# Patient Record
Sex: Female | Born: 1995 | Race: Black or African American | Hispanic: No | Marital: Married | Smoking: Never smoker
Health system: Southern US, Community
[De-identification: ages and names within clinical notes are randomized; demographics above are authoritative.]

## PROBLEM LIST (undated history)

## (undated) ENCOUNTER — Inpatient Hospital Stay (HOSPITAL_COMMUNITY): Payer: Self-pay

## (undated) DIAGNOSIS — Z98891 History of uterine scar from previous surgery: Secondary | ICD-10-CM

---

## 2014-10-13 HISTORY — PX: WISDOM TOOTH EXTRACTION: SHX21

## 2015-05-30 ENCOUNTER — Encounter (HOSPITAL_COMMUNITY): Payer: Self-pay | Admitting: Emergency Medicine

## 2015-05-30 ENCOUNTER — Emergency Department (INDEPENDENT_AMBULATORY_CARE_PROVIDER_SITE_OTHER)
Admission: EM | Admit: 2015-05-30 | Discharge: 2015-05-30 | Disposition: A | Discharge status: Home / Self Care | Payer: Medicaid Other | Source: Home / Self Care | Attending: Emergency Medicine | Admitting: Emergency Medicine

## 2015-05-30 DIAGNOSIS — N390 Urinary tract infection, site not specified: Secondary | ICD-10-CM

## 2015-05-30 DIAGNOSIS — Z309 Encounter for contraceptive management, unspecified: Secondary | ICD-10-CM

## 2015-05-30 DIAGNOSIS — Z30011 Encounter for initial prescription of contraceptive pills: Secondary | ICD-10-CM

## 2015-05-30 LAB — POCT URINALYSIS DIP (DEVICE)
BILIRUBIN URINE: NEGATIVE
GLUCOSE, UA: NEGATIVE mg/dL
Ketones, ur: NEGATIVE mg/dL
NITRITE: NEGATIVE
Protein, ur: NEGATIVE mg/dL
Specific Gravity, Urine: 1.025 (ref 1.005–1.030)
UROBILINOGEN UA: 0.2 mg/dL (ref 0.0–1.0)
pH: 6 (ref 5.0–8.0)

## 2015-05-30 LAB — POCT PREGNANCY, URINE: Preg Test, Ur: NEGATIVE

## 2015-05-30 MED ORDER — CEPHALEXIN 500 MG PO CAPS: 500.0000 mg | ORAL_CAPSULE | Freq: Four times a day (QID) | ORAL | Status: DC

## 2015-05-30 MED ORDER — NORGESTIMATE-ETH ESTRADIOL 0.25-35 MG-MCG PO TABS: 1.0000 | ORAL_TABLET | Freq: Every day | ORAL | Status: DC

## 2015-05-30 NOTE — ED Provider Notes (Signed)
CSN: [id number]     Arrival date & time 05/30/15  1302 History   First MD Initiated Contact with Patient 05/30/15 1310     Chief Complaint  Patient presents with  . Urinary Tract Infection   (Consider location/radiation/quality/duration/timing/severity/associated sxs/prior Treatment) HPI  She is a 19 year old woman here with her husband for evaluation of dysuria. She speaks Swahili. Her husband acts as interpreter at her request.  She reports a four-day history of urinary frequency, urinary hesitation, and dysuria. She also reports some lower abdominal pain. She reports a little bit of nausea, but this has resolved. No fevers, chills, vomiting, flank pain. She has not tried anything.  They would also like something to prevent pregnancy. She is currently breast-feeding an 74-month-old daughter. She has not resumed her menstrual cycle. No history of smoking.  History reviewed. No pertinent past medical history. No past surgical history on file. History reviewed. No pertinent family history. Social History  Substance Use Topics  . Smoking status: None  . Smokeless tobacco: None  . Alcohol Use: None   OB History    No data available     Review of Systems As in history of present illness Allergies  Review of patient's allergies indicates no known allergies.  Home Medications   Prior to Admission medications   Medication Sig Start Date End Date Taking? Authorizing Provider  cephALEXin (KEFLEX) 500 MG capsule Take 1 capsule (500 mg total) by mouth 4 (four) times daily. 05/30/15   Charm Rings, MD  norgestimate-ethinyl estradiol (ORTHO-CYCLEN,SPRINTEC,PREVIFEM) 0.25-35 MG-MCG tablet Take 1 tablet by mouth daily. Start on Sunday 05/30/15   Charm Rings, MD   BP 142/89 mmHg  Pulse 55  Temp(Src) 98 F (36.7 C) (Oral)  Resp 16  SpO2 99%  LMP 05/08/2015 Physical Exam  Constitutional: She appears well-developed and well-nourished. No distress.  Cardiovascular: Normal rate, regular  rhythm and normal heart sounds.   No murmur heard. Pulmonary/Chest: Effort normal and breath sounds normal. No respiratory distress. She has no wheezes. She has no rales.  Abdominal: Soft. Bowel sounds are normal. She exhibits no distension. There is tenderness (in suprapubic). There is no rebound and no guarding.  No CVA tenderness.    ED Course  Procedures (including critical care time) Labs Review Labs Reviewed  POCT URINALYSIS DIP (DEVICE) - Abnormal; Notable for the following:    Hgb urine dipstick TRACE (*)    Leukocytes, UA SMALL (*)    All other components within normal limits  URINE CULTURE  POCT PREGNANCY, URINE    Imaging Review No results found.   MDM   1. UTI (lower urinary tract infection)   2. BCP (birth control pills) initiation    Treat with Keflex for 3 days. Provided prescription for 1 month of OCPs. Discussed that there is a small chance this will decrease her milk supply. Recommended follow-up at the health department to discuss additional birth control options.    Charm Rings, MD 05/30/15 [phone]

## 2015-05-30 NOTE — ED Notes (Signed)
C/o uti States she has pain when urinating for four days States lower abd pain No med taking

## 2015-05-30 NOTE — Discharge Instructions (Signed)
You have a urinary tract infection. Take Keflex one pill 4 times a day for 3 days. Make sure you are drinking plenty of water.  Start the birth control pills on Sunday. You will have a period during the 4th week of pills. They take 2 weeks to become effective. There is a small chance that these pills will decrease your milk supply.  Please follow-up at the health department to talk about birth control options.

## 2015-06-01 LAB — URINE CULTURE

## 2015-06-01 MED ORDER — CIPROFLOXACIN HCL 500 MG PO TABS: 500.0000 mg | ORAL_TABLET | Freq: Two times a day (BID) | ORAL | Status: DC

## 2015-06-01 NOTE — ED Notes (Addendum)
MD reviewed final C&S report of UA culture, indicated new Rx . Called  Residence, and family spoke to RN, as patient does not speak English to advise of need for new Rx. Rx for Cipro 500 mg, BID x 7 days, PO, to [facility], [employer] called in at patient request . Left detailed message on answering machine for pharmacy staff

## 2015-08-22 ENCOUNTER — Ambulatory Visit (INDEPENDENT_AMBULATORY_CARE_PROVIDER_SITE_OTHER): Payer: Medicaid Other | Admitting: Family Medicine

## 2015-08-22 VITALS — BP 129/79 | HR 58 | Temp 98.1°F | Ht 61.0 in | Wt 90.1 lb

## 2015-08-22 DIAGNOSIS — Z0289 Encounter for other administrative examinations: Secondary | ICD-10-CM

## 2015-08-22 DIAGNOSIS — Z008 Encounter for other general examination: Secondary | ICD-10-CM

## 2015-08-22 DIAGNOSIS — Z603 Acculturation difficulty: Secondary | ICD-10-CM | POA: Insufficient documentation

## 2015-08-22 DIAGNOSIS — Z789 Other specified health status: Secondary | ICD-10-CM | POA: Diagnosis not present

## 2015-08-22 DIAGNOSIS — IMO0001 Reserved for inherently not codable concepts without codable children: Secondary | ICD-10-CM | POA: Insufficient documentation

## 2015-08-22 NOTE — Progress Notes (Signed)
Sally Hampton utilized during today's visit.  [facility] New Patient Visit  HPI:  [facility] today for a new patient appointment to establish general primary care, denies current issues, denies fevers, cough, N/V/D does report recent UTI which was treated at the ED in 05/2015  Husband later reported she has been having abdominal pain after eating but she did not divulge this herself. As she does not speak english and the interpreter was not need for her husband's portion of the visit  ROS:  Otherwise see HPI  Past Medical Hx:  -Denies  Past Surgical Hx:  - Cesarean Section  OB Hx [id number] cesarean sections x2  Family Hx: updated in Epic - Number of family members:  4, including herself - Number of family members in [place]:  4, inculing her self  Immigrant Social History: - Name spelling correct?: Yes - Date arrived in [place]: 03/24/2016 - Country of origin: [place] - Location of refugee camp (if applicable), how long there, and what caused patient to leave home country?: None, traveled to [place] - Primary language: Swahili  -Requires intepreter (essentially speaks no [place]) - Education: Highest level of education: 9th grade - 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  No  Does report decreased appetite since arriving to the [place], if eats in the AM does not wish to eat again until late at night. Does not feel this is due to anxiety  Preventative Care History: -Seen at health department?: Yes  PHYSICAL EXAM: BP 129/79 mmHg  Pulse 58  Temp(Src) 98.1 F (36.7 C) (Oral)  Ht 5\' 1"  (1.549 m)  Wt 90 lb 1.6 oz (40.869 kg)  BMI 17.03 kg/m2  LMP 07/25/2015 Gen: NAD HEENT: PERRL, normal conjunctiva, notmal  oropharynx Neck:  No masses palpated Heart: RRR Lungs: CTAB, normal WOB Abdomen: + BS, soft, non tender Skin:  No rashes or lesions noted Neuro: no focal deficits Psych: normal mood and affect  Examined and interviewed with Dr. Gwendolyn GrantWalden  Contraception Previously on OCPs, recently had an " injection" for birth control at the Health Dept - Will get records about what she had and when to continue appropriately if she should so choose  Refugee health examination Follow up labs   Question of abdominal pain - Pt will record when she has pain to discuss at next visit   Sally Meech A. Kennon RoundsHaney MD, MS Family Medicine Resident PGY-2 Pager [phone]

## 2015-08-22 NOTE — Assessment & Plan Note (Signed)
Follow up labs

## 2015-08-22 NOTE — Patient Instructions (Addendum)
Please follow up on 11/30 If you have questions or concerns, call the office at [phone] Write down when you have abdominal pain and bring to your next office visit

## 2015-08-22 NOTE — Assessment & Plan Note (Signed)
Previously on OCPs, recently had an " injection" for birth control at the Health Dept - Will get records about what she had and when to continue appropriately if she should so choose

## 2015-09-12 ENCOUNTER — Ambulatory Visit: Payer: Medicaid Other | Admitting: Student

## 2015-09-20 ENCOUNTER — Ambulatory Visit (INDEPENDENT_AMBULATORY_CARE_PROVIDER_SITE_OTHER): Payer: Medicaid Other | Admitting: Student

## 2015-09-20 VITALS — BP 141/89 | HR 52 | Temp 97.5°F | Ht 61.0 in | Wt 90.9 lb

## 2015-09-20 DIAGNOSIS — N912 Amenorrhea, unspecified: Secondary | ICD-10-CM | POA: Diagnosis present

## 2015-09-20 DIAGNOSIS — R630 Anorexia: Secondary | ICD-10-CM

## 2015-09-20 NOTE — Patient Instructions (Signed)
Follow up in 1 month If you have questions or concerns, call the office at [phone] 

## 2015-09-21 DIAGNOSIS — R630 Anorexia: Secondary | ICD-10-CM | POA: Insufficient documentation

## 2015-09-21 NOTE — Assessment & Plan Note (Signed)
Likely due to Nexplanon. Discussed the effects that Nexplanon can have on menses and that amenorrhea or irregular menses are a common side effect of nexplanon - Will continue to follow

## 2015-09-21 NOTE — Assessment & Plan Note (Signed)
Decreased appetite [place] to the [place], despite having access to traditional cultural foods. Concern that there may be a strong mood component to this, however will rule out organic causes - Will obtain labs testing from the health department - if unable to obtain by next visit, will collect at this clinic - Return to clinic in 1 month

## 2015-09-21 NOTE — Progress Notes (Signed)
   Subjective:    Patient ID: Sally KaufmanRachel Hampton, female    DOB: 07-Sep-1996, 19 y.o.   MRN: [id number]  [place] interpreter utilized for entirety of the visit  CC: follow up  HPI 19 y/o refugee who presents for follow up. She also concerned as she last had menses 2 months ago. She additionally reports decreased appetite since moving to the [place]  Amenorrhea - Pt had nexplanon placed on 07/11/2015 at the health department - She brought documentation of this with her - she has not otherwise had pregnancy related symptoms, breast tenderness, N/V   Decreased appetite - She reports that since coming to [place], she has started to eat one meal per day - She denies desire to eat any other meals in a given day - Denies abdominal pain, nausea, or early satiety to keep her from eating, she just does not have an appetite - She reports that she does have access to some of the food she is used to in her home country so she has not had to completely change her diet - She denies low mood, does not report increased fatigue  Sally Hampton denies recent illness, fevers/chills, N/V/D  Review of Systems   See HPI for ROS.   Objective:  BP 141/89 mmHg  Pulse 52  Temp(Src) 97.5 F (36.4 C) (Oral)  Ht 5\' 1"  (1.549 m)  Wt 90 lb 14.4 oz (41.232 kg)  BMI 17.18 kg/m2  LMP 07/25/2015 Vitals and nursing note reviewed  General: NAD Cardiac: RRR,  Respiratory: CTAB, normal effort Abdomen: soft, non tender MSK- nexplanon palpated on left upper arm Skin: warm and dry, no rashes noted Neuro: alert and oriented, no focal deficits   Assessment & Plan:    Amenorrhea Likely due to Nexplanon. Discussed the effects that Nexplanon can have on menses and that amenorrhea or irregular menses are a common side effect of nexplanon - Will continue to follow  Decreased appetite Decreased appetite [place] to the [place], despite having access to traditional cultural foods. Concern that there may be a strong mood component to  this, however will rule out organic causes - Will obtain labs testing from the health department - if unable to obtain by next visit, will collect at this clinic - Return to clinic in 1 month     Sally Hampton A. Kennon RoundsHaney MD, MS Family Medicine Resident PGY-2 Pager [phone]

## 2015-10-11 ENCOUNTER — Ambulatory Visit: Payer: Medicaid Other | Admitting: Student

## 2015-10-29 ENCOUNTER — Ambulatory Visit (INDEPENDENT_AMBULATORY_CARE_PROVIDER_SITE_OTHER): Payer: Medicaid Other | Admitting: Student

## 2015-10-29 ENCOUNTER — Encounter: Payer: Self-pay | Admitting: Student

## 2015-10-29 VITALS — Ht 61.0 in | Wt 87.0 lb

## 2015-10-29 DIAGNOSIS — R630 Anorexia: Secondary | ICD-10-CM

## 2015-10-29 DIAGNOSIS — Z008 Encounter for other general examination: Secondary | ICD-10-CM

## 2015-10-29 DIAGNOSIS — Z0289 Encounter for other administrative examinations: Secondary | ICD-10-CM

## 2015-10-29 LAB — CBC WITH DIFFERENTIAL/PLATELET
Basophils Absolute: 0 10*3/uL (ref 0.0–0.1)
Basophils Relative: 0 % (ref 0–1)
EOS PCT: 1 % (ref 0–5)
Eosinophils Absolute: 0.1 10*3/uL (ref 0.0–0.7)
HEMATOCRIT: 42.5 % (ref 36.0–46.0)
HEMOGLOBIN: 14.2 g/dL (ref 12.0–15.0)
LYMPHS PCT: 42 % (ref 12–46)
Lymphs Abs: 2.9 10*3/uL (ref 0.7–4.0)
MCH: 30.5 pg (ref 26.0–34.0)
MCHC: 33.4 g/dL (ref 30.0–36.0)
MCV: 91.2 fL (ref 78.0–100.0)
MONOS PCT: 8 % (ref 3–12)
MPV: 9.6 fL (ref 8.6–12.4)
Monocytes Absolute: 0.6 10*3/uL (ref 0.1–1.0)
NEUTROS ABS: 3.4 10*3/uL (ref 1.7–7.7)
Neutrophils Relative %: 49 % (ref 43–77)
Platelets: 244 10*3/uL (ref 150–400)
RBC: 4.66 MIL/uL (ref 3.87–5.11)
RDW: 13.6 % (ref 11.5–15.5)
WBC: 6.9 10*3/uL (ref 4.0–10.5)

## 2015-10-29 NOTE — Progress Notes (Signed)
   Subjective:    Patient ID: Sally KaufmanRachel Hampton, female    DOB: 1996-03-12, 20 y.o.   MRN: [id number]  Swahili interpreter used for entire visit  CC: presenting for follow up  HPI: 20 y/o presenting for follow up. She does report a cold 3 days   Cold - Has felt more tired, had a sore throat and had decreased appetite that started 3 days ago. Sore throat had since resolved - denies fever but doe snot have a thermometer. 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   Objective:  Ht 5\' 1"  (1.549 m)  Wt 87 lb (39.463 kg)  BMI 16.45 kg/m2 Vitals and nursing note reviewed  General: NAD Cardiac: RRR,  Respiratory: CTAB, normal effort Skin: warm and dry, no rashes noted Neuro: alert and oriented, no focal deficits   Assessment & Plan:    Decreased appetite Has lost 3 lbs since last visit on 12/8. Potentially due to rigors of working and being too tired to eat full dinner.  - Will chest TSH today - pt encouraged to not only eat full meals but incorporate snacks between meals  Refugee health examination Refugee labs not received from the health Department.  - Will draws labs today     Reece Fehnel A. Kennon RoundsHaney MD, MS Family Medicine Resident PGY-2 Pager [phone]

## 2015-10-29 NOTE — Patient Instructions (Signed)
Follow up in 3 months If you have questions or concerns, call the office at [phone] 

## 2015-10-29 NOTE — Assessment & Plan Note (Signed)
Refugee labs not received from the health Department.  - Will draws labs today

## 2015-10-29 NOTE — Assessment & Plan Note (Signed)
Has lost 3 lbs since last visit on 12/8. Potentially due to rigors of working and being too tired to eat full dinner.  - Will chest TSH today - pt encouraged to not only eat full meals but incorporate snacks between meals

## 2015-10-30 LAB — HEPATITIS C ANTIBODY: HCV Ab: NEGATIVE

## 2015-10-30 LAB — HEPATITIS B SURFACE ANTIBODY,QUALITATIVE: Hep B S Ab: POSITIVE — AB

## 2015-10-30 LAB — HIV ANTIBODY (ROUTINE TESTING W REFLEX): HIV: NONREACTIVE

## 2015-10-30 LAB — TSH: TSH: 3.217 u[IU]/mL (ref 0.350–4.500)

## 2015-10-30 LAB — HEPATITIS B SURFACE ANTIGEN: Hepatitis B Surface Ag: NEGATIVE

## 2015-10-31 LAB — HEMOGLOBINOPATHY EVALUATION
HEMOGLOBIN OTHER: 0 %
Hgb A2 Quant: 2.6 % (ref 2.2–3.2)
Hgb A: 97.4 % (ref 96.8–97.8)
Hgb F Quant: 0 % (ref 0.0–2.0)
Hgb S Quant: 0 %

## 2015-10-31 LAB — QUANTIFERON TB GOLD ASSAY (BLOOD)
INTERFERON GAMMA RELEASE ASSAY: NEGATIVE
Quantiferon Nil Value: 0.07 IU/mL
Quantiferon Tb Ag Minus Nil Value: 0.28 IU/mL
TB Ag value: 0.35 IU/mL

## 2016-04-22 ENCOUNTER — Ambulatory Visit (INDEPENDENT_AMBULATORY_CARE_PROVIDER_SITE_OTHER): Payer: Self-pay | Admitting: Student

## 2016-04-22 ENCOUNTER — Encounter: Payer: Self-pay | Admitting: Student

## 2016-04-22 VITALS — BP 140/82 | HR 55 | Temp 98.1°F | Ht 61.0 in | Wt 90.0 lb

## 2016-04-22 DIAGNOSIS — N912 Amenorrhea, unspecified: Secondary | ICD-10-CM

## 2016-04-22 NOTE — Progress Notes (Signed)
   Subjective:    Patient ID: Sally Hampton, female    DOB: [birth year], 20 y.o.   MRN: [id number]  Swahili phone interpreter used  CC: concern for ammenorhea  HPI: 20 y/o F presenting for ammenorhea since her nexplanon was placed  Ammenorrhea - nexplanon placed 07/11/2015 and she presented her Nexaplanon placard to confirm this - reports her perios were irregular after the birth of her last child then since she had the nexplanon she has not had menses - LMP was in 06/2015 prior to nexplanon placement  Smoking status reviewed  Review of Systems  Per HPI else denies recent illness, SOB, chest pain, vaginal irritation, or discharge, abdominal pain    Objective:  BP 140/82 mmHg  Pulse 55  Temp(Src) 98.1 F (36.7 C) (Oral)  Ht 5\' 1"  (1.549 m)  Wt 90 lb (40.824 kg)  BMI 17.01 kg/m2 Vitals and nursing note reviewed  General: NAD Cardiac: RRR,  Respiratory: CTAB, normal effort Extremities: nexplanon in place in left upper arm Skin: warm and dry, no rashes noted Neuro: alert and oriented  Assessment & Plan:    Amenorrhea As previously documented, her ammenorhea is likely due to nexplanon being in place as she did have menses prior to its placement - counseled the patient that this can be expecteted and is not dangerous - discussed that it can be removed at any time and her fertility would return     Jakari Jacot A. Kennon RoundsHaney MD, MS Family Medicine Resident PGY-2 Pager [phone]

## 2016-04-22 NOTE — Assessment & Plan Note (Addendum)
As previously documented, her ammenorhea is likely due to nexplanon being in place as she did have menses prior to its placement - counseled the patient that this can be expecteted and is not dangerous - discussed that it can be removed at any time and her fertility would return - the pateint expressed her understanding and acceptance

## 2016-04-22 NOTE — Patient Instructions (Signed)
Follow up in 6 months If you have questions or concerns, call the office at [phone]

## 2017-01-07 ENCOUNTER — Encounter: Payer: Self-pay | Admitting: Student

## 2017-01-07 ENCOUNTER — Ambulatory Visit (INDEPENDENT_AMBULATORY_CARE_PROVIDER_SITE_OTHER): Payer: Self-pay | Admitting: Student

## 2017-01-07 VITALS — BP 110/70 | HR 58 | Temp 98.3°F | Wt 91.0 lb

## 2017-01-07 DIAGNOSIS — Z0000 Encounter for general adult medical examination without abnormal findings: Secondary | ICD-10-CM

## 2017-01-07 DIAGNOSIS — H579 Unspecified disorder of eye and adnexa: Secondary | ICD-10-CM

## 2017-01-07 NOTE — Assessment & Plan Note (Signed)
Eye lesion of unclear cause but differential includes corneal ulcer. Given this concern, will refer urgently for ophtho exam - the appointment was made in clinic and patient was counseled strongly to present

## 2017-01-07 NOTE — Progress Notes (Signed)
   Subjective:    Patient ID: Delano MetzRachel Kabingulu, female    DOB: [birth year], 21 y.o.   MRN: [id number]   CC: right eye lesion  HPI: 21 y/o F presents for right eye lesion  Right eye lesion - noticed it two weeks ago - it feels like she has grains of sand in her eye - she denies eye pain, just irritation - she has had watery eye discharge but also purulent discharge - she denies changes in vision or pain with eye movement - denies photosensitivity  Social - she has applied for insurance with her job and states it should be active soon - she wishes to wait for her job insurance before applying for the orange care  Healthcare maintenance - due for tdap and pap smear  Smoking status reviewed  Review of Systems  Per HPI, else denies recent illness, fever, chest pain, shortness of breath,   Objective:  BP 110/70   Pulse (!) 58   Temp 98.3 F (36.8 C) (Oral)   Wt 91 lb (41.3 kg)   LMP  (LMP Unknown)   SpO2 99%   BMI 17.19 kg/m  Vitals and nursing note reviewed  General: NAD HEENT: EOMI, PERRL, right eye with with approximately 0.25 cm white lesion spanning across the border between the cornea and sclera Cardiac: RRR Respiratory: CTAB, normal effort Skin: warm and dry, no rashes noted Neuro: alert and oriented, no focal deficits   Assessment & Plan:    Eye lesion Eye lesion of unclear cause but differential includes corneal ulcer. Given this concern, will refer urgently for ophtho exam - the appointment was made in clinic and patient was counseled strongly to present  Health care maintenance Due for tdap and pap smear, she wishes to wait until she has insurance to obtain these - will follow  Insurance status - Will follow on insurance status, and offer orange card again as needed   Lonnette Shrode A. Kennon RoundsHaney MD, MS Family Medicine Resident PGY-3 Pager [phone]

## 2017-01-07 NOTE — Assessment & Plan Note (Signed)
Due for tdap and pap smear, she wishes to wait until she has insurance to obtain these - will follow

## 2017-01-07 NOTE — Patient Instructions (Addendum)
Please go to ophthalmology for your eye as scheduled If your symptoms worsen, go to the ED for care Make an appointment after you obtain insurance for your pap smear If you have questions or concerns, call the office at [phone]

## 2018-03-31 ENCOUNTER — Ambulatory Visit (INDEPENDENT_AMBULATORY_CARE_PROVIDER_SITE_OTHER): Payer: Medicaid Other

## 2018-03-31 ENCOUNTER — Encounter: Payer: Self-pay | Admitting: Family Medicine

## 2018-03-31 ENCOUNTER — Other Ambulatory Visit: Payer: Self-pay

## 2018-03-31 VITALS — BP 126/74 | HR 58 | Wt 95.0 lb

## 2018-03-31 DIAGNOSIS — N912 Amenorrhea, unspecified: Secondary | ICD-10-CM

## 2018-03-31 DIAGNOSIS — Z3201 Encounter for pregnancy test, result positive: Secondary | ICD-10-CM | POA: Diagnosis not present

## 2018-03-31 LAB — POCT PREGNANCY, URINE: Preg Test, Ur: POSITIVE — AB

## 2018-03-31 NOTE — Progress Notes (Signed)
Patient here today for pregnancy test.  Test is positive. 03/04/2018  EDD:11/14/2018  Patient had nexplanon removed 02/08/2018.  Advised patient to start prenatal medication and see prenatal care.  She is having some nausea and dizziness, gave her some information regarding this as well. Educated patient if she has any bleeding or issues to go to [facility].

## 2018-03-31 NOTE — Patient Instructions (Signed)
Start prenatal vitamins you can get them over the counter at your pharmacy.   Morning Sickness Morning sickness is when you feel sick to your stomach (nauseous) during pregnancy. You may feel sick to your stomach and throw up (vomit). You may feel sick in the morning, but you can feel this way any time of day. Some women feel very sick to their stomach and cannot stop throwing up (hyperemesis gravidarum). Follow these instructions at home:  Only take medicines as told by your doctor.  Take multivitamins as told by your doctor. Taking multivitamins before getting pregnant can stop or lessen the harshness of morning sickness.  Eat dry toast or unsalted crackers before getting out of bed.  Eat 5 to 6 small meals a day.  Eat dry and bland foods like rice and baked potatoes.  Do not drink liquids with meals. Drink between meals.  Do not eat greasy, fatty, or spicy foods.  Have someone cook for you if the smell of food causes you to feel sick or throw up.  If you feel sick to your stomach after taking prenatal vitamins, take them at night or with a snack.  Eat protein when you need a snack (nuts, yogurt, cheese).  Eat unsweetened gelatins for dessert.  Wear a bracelet used for sea sickness (acupressure wristband).  Go to a doctor that puts thin needles into certain body points (acupuncture) to improve how you feel.  Do not smoke.  Use a humidifier to keep the air in your house free of odors.  Get lots of fresh air. Contact a doctor if:  You need medicine to feel better.  You feel dizzy or lightheaded.  You are losing weight. Get help right away if:  You feel very sick to your stomach and cannot stop throwing up.  You pass out (faint). This information is not intended to replace advice given to you by your health care provider. Make sure you discuss any questions you have with your health care provider. Document Released: 11/06/2004 Document Revised: 03/06/2016 Document  Reviewed: 03/16/2013 Elsevier Interactive Patient Education  2017 [place]. Common Medications Safe in Pregnancy  Acne:      Constipation:  Benzoyl Peroxide     Colace  Clindamycin      Dulcolax Suppository  Topica Erythromycin     Fibercon  Salicylic Acid      Metamucil         Miralax AVOID:        Senakot   Accutane    Cough:  Retin-A       Cough Drops  Tetracycline      Phenergan w/ Codeine if Rx  Minocycline      Robitussin (Plain & DM)  Antibiotics:     Crabs/Lice:  Ceclor       RID  Cephalosporins    AVOID:  E-Mycins      Kwell  Keflex  Macrobid/Macrodantin   Diarrhea:  Penicillin      Kao-Pectate  Zithromax      Imodium AD         PUSH FLUIDS AVOID:       Cipro     Fever:  Tetracycline      Tylenol (Regular or Extra  Minocycline       Strength)  Levaquin      Extra Strength-Do not          Exceed 8 tabs/24 hrs Caffeine:        <278m/day (equiv. To 1 cup of  coffee or  approx. 3 12 oz sodas)         Gas: Cold/Hayfever:       Gas-X  Benadryl      Mylicon  Claritin       Phazyme  **Claritin-D        Chlor-Trimeton    Headaches:  Dimetapp      ASA-Free Excedrin  Drixoral-Non-Drowsy     Cold Compress  Mucinex (Guaifenasin)     Tylenol (Regular or Extra  Sudafed/Sudafed-12 Hour     Strength)  **Sudafed PE Pseudoephedrine   Tylenol Cold & Sinus     Vicks Vapor Rub  Zyrtec  **AVOID if Problems With Blood Pressure         Heartburn: Avoid lying down for at least 1 hour after meals  Aciphex      Maalox     Rash:  Milk of Magnesia     Benadryl    Mylanta       1% Hydrocortisone Cream  Pepcid  Pepcid Complete   Sleep Aids:  Prevacid      Ambien   Prilosec       Benadryl  Rolaids       Chamomile Tea  Tums (Limit 4/day)     Unisom  Zantac       Tylenol PM         Warm milk-add vanilla or  Hemorrhoids:       Sugar for taste  Anusol/Anusol H.C.  (RX: Analapram 2.5%)  Sugar Substitutes:  Hydrocortisone OTC     Ok in moderation  Preparation  H      Tucks        Vaseline lotion applied to tissue with wiping    Herpes:     Throat:  Acyclovir      Oragel  Famvir  Valtrex     Vaccines:         Flu Shot Leg Cramps:       *Gardasil  Benadryl      Hepatitis A         Hepatitis B Nasal Spray:       Pneumovax  Saline Nasal Spray     Polio Booster         Tetanus Nausea:       Tuberculosis test or PPD  Vitamin B6 25 mg TID   AVOID:    Dramamine      *Gardasil  Emetrol       Live Poliovirus  Ginger Root 250 mg QID    MMR (measles, mumps &  High Complex Carbs @ Bedtime    rebella)  Sea Bands-Accupressure    Varicella (Chickenpox)  Unisom 1/2 tab TID     *No known complications           If received before Pain:         Known pregnancy;   Darvocet       Resume series after  Lortab        Delivery  Percocet    Yeast:   Tramadol      Femstat  Tylenol 3      Gyne-lotrimin  Ultram       Monistat  Vicodin           MISC:         All Sunscreens           Hair Coloring/highlights          Insect Repellant's          (  Including DEET)         Mystic Tans

## 2018-05-10 ENCOUNTER — Ambulatory Visit (INDEPENDENT_AMBULATORY_CARE_PROVIDER_SITE_OTHER): Payer: BLUE CROSS/BLUE SHIELD | Admitting: Student

## 2018-05-10 ENCOUNTER — Encounter: Payer: Self-pay | Admitting: General Practice

## 2018-05-10 ENCOUNTER — Other Ambulatory Visit (HOSPITAL_COMMUNITY)
Admission: RE | Admit: 2018-05-10 | Discharge: 2018-05-10 | Disposition: A | Discharge status: Home / Self Care | Payer: BLUE CROSS/BLUE SHIELD | Source: Ambulatory Visit | Attending: Student | Admitting: Student

## 2018-05-10 ENCOUNTER — Encounter: Payer: Self-pay | Admitting: Student

## 2018-05-10 VITALS — BP 121/89 | HR 61 | Wt 93.9 lb

## 2018-05-10 DIAGNOSIS — Z3481 Encounter for supervision of other normal pregnancy, first trimester: Secondary | ICD-10-CM | POA: Insufficient documentation

## 2018-05-10 DIAGNOSIS — O219 Vomiting of pregnancy, unspecified: Secondary | ICD-10-CM

## 2018-05-10 DIAGNOSIS — Z98891 History of uterine scar from previous surgery: Secondary | ICD-10-CM | POA: Insufficient documentation

## 2018-05-10 DIAGNOSIS — Z3A10 10 weeks gestation of pregnancy: Secondary | ICD-10-CM | POA: Insufficient documentation

## 2018-05-10 LAB — POCT URINALYSIS DIP (DEVICE)
GLUCOSE, UA: NEGATIVE mg/dL
Leukocytes, UA: NEGATIVE
Nitrite: NEGATIVE
PH: 6.5 (ref 5.0–8.0)
PROTEIN: 30 mg/dL — AB
Specific Gravity, Urine: 1.025 (ref 1.005–1.030)
Urobilinogen, UA: 0.2 mg/dL (ref 0.0–1.0)

## 2018-05-10 MED ORDER — DOXYLAMINE SUCCINATE (SLEEP) 25 MG PO TABS: 25.0000 mg | ORAL_TABLET | Freq: Every evening | ORAL | 0 refills | Status: DC | PRN

## 2018-05-10 MED ORDER — VITAMIN B-6 100 MG PO TABS: 100.0000 mg | ORAL_TABLET | Freq: Two times a day (BID) | ORAL | 0 refills | Status: DC

## 2018-05-10 NOTE — Progress Notes (Signed)
Subjective:   Sally Hampton is a 22 y.o. [id number] at 2535w1d by LMP being seen today for her first obstetrical visit.  Her obstetrical history is not signifanct. Patient does intend to breast feed. Pregnancy history fully reviewed. Patient with 2 prior C-S. First CS for failure to progress. 2nd was a scheduled, repeat C-S. She desires TOLAC with this pregnancy. She has been having trouble swallowing her PNV and has had emesis after taking vitamin. Also endorses emesis and nausea with most attempts at eating.   Patient reports fatigue, nausea, no bleeding, no cramping and no leaking.  HISTORY: OB History  Gravida Para Term Preterm AB Living  4 2 2  0 1 2  SAB TAB Ectopic Multiple Live Births  1 0 0 0 2    # Outcome Date GA Lbr Len/2nd Weight Sex Delivery Anes PTL Lv  4 Current           3 Term 09/30/14 457w0d  7 lb 7.9 oz (3.4 kg) F CS-LTranv Spinal N LIV  2 SAB 2014          1 Term 06/11/12 4726w0d  6 lb 9.8 oz (3 kg) M CS-LTranv EPI  LIV     Complications: Failure to Progress in First Stage, Failure to progress in labor   History reviewed. No pertinent past medical history. History reviewed. No pertinent surgical history. History reviewed. No pertinent family history. Social History   Tobacco Use  . Smoking status: Never Smoker  . Smokeless tobacco: Never Used  Substance Use Topics  . Alcohol use: Not Currently    Alcohol/week: 0.0 oz  . Drug use: Not Currently   No Known Allergies No current outpatient medications on file prior to visit.   No current facility-administered medications on file prior to visit.     Indications for ASA therapy (per uptodate) One of the following: Previous pregnancy with preeclampsia, especially early onset and with an adverse outcome No Multifetal gestation No Chronic hypertension No Type 1 or 2 diabetes mellitus No Chronic kidney disease No Autoimmune disease (antiphospholipid syndrome, systemic lupus erythematosus) No  Two or more of  the following: Nulliparity No Obesity (body mass index >30 kg/m2) No Family history of preeclampsia in mother or sister No Age ?35 years No Sociodemographic characteristics (African American race, low socioeconomic level) Yes Personal risk factors (eg, previous pregnancy with low birth weight or small for gestational age infant, previous adverse pregnancy outcome [eg, stillbirth], interval >10 years between pregnancies) No  Indications for early 1 hour GTT: no   Exam   Vitals:   05/10/18 1412  BP: 121/89  Pulse: 61  Weight: 93 lb 14.4 oz (42.6 kg)   Fetal Heart Rate (bpm): 143  Uterus:     Pelvic Exam: Perineum: no hemorrhoids, normal perineum   Vulva: normal external genitalia, no lesions   Vagina:  normal mucosa, normal discharge   Cervix: no lesions and normal, pap smear done.    Adnexa: normal adnexa and no mass, fullness, tenderness   Bony Pelvis: average  System: General: well-developed, well-nourished female in no acute distress   Skin: normal coloration and turgor, no rashes   Neurologic: oriented, normal, negative, normal mood   Extremities: normal strength, tone, and muscle mass, ROM of all joints is normal   HEENT PERRLA, extraocular movement intact and sclera clear, anicteric   Mouth/Teeth mucous membranes moist, pharynx normal without lesions and dental hygiene good   Neck supple and no masses   Cardiovascular: regular  rate and rhythm   Respiratory:  no respiratory distress, normal breath sounds   Abdomen: soft, non-tender; bowel sounds normal; no masses,  no organomegaly     Assessment:   Pregnancy: [id number] Patient Active Problem List   Diagnosis Date Noted  . Eye lesion 01/07/2017  . Health care maintenance 01/07/2017  . Amenorrhea 09/21/2015  . Decreased appetite 09/21/2015  . Refugee health examination 08/22/2015  . Language barrier 08/22/2015     Plan:  1. Encounter for supervision of other normal pregnancy in first trimester - [facility] AMB  BABYSCRIPTS OPT IN - Culture, OB Urine - Cystic fibrosis gene test - Cytology - PAP - Genetic Screening - Obstetric Panel, Including HIV - SMN1 COPY NUMBER ANALYSIS (SMA Carrier Screen) - [place] MFM OB DETAIL +14 WK; Future  2. Nausea and vomiting in pregnancy Discussed that nausea/vomiting typically limited to first trimester. Counseled on diet changes. Strict return precautions for excessive vomiting and dehydration discussed.  - pyridOXINE (VITAMIN B-6) 100 MG tablet; Take 1 tablet (100 mg total) by mouth 2 (two) times daily.  Dispense: 60 tablet; Refill: 0 - doxylamine, Sleep, (UNISOM) 25 MG tablet; Take 1 tablet (25 mg total) by mouth at bedtime as needed.  Dispense: 30 tablet; Refill: 0  3. History of C-section Will need TOLAC consent signed at future OB visit.    Initial labs drawn. Flu vax discussed. Will offer when available.   Continue prenatal vitamins. Genetic Screening discussed, Panorama: ordered. Ultrasound discussed; fetal anatomic survey: ordered. Problem list reviewed and updated. The nature of [facility] with multiple MDs and other Advanced Practice Providers was explained to patient; also emphasized that residents, students are part of our team. Routine obstetric precautions reviewed. No follow-ups on file.   De Hollingshead 2:41 PM 05/10/18

## 2018-05-10 NOTE — Progress Notes (Signed)
Interperter Fani Washiti  Anatomy US scheduled for October 4th 0930.  Pt notified.

## 2018-05-10 NOTE — Patient Instructions (Signed)
Nausea & Vomiting  Have saltine crackers or pretzels by your bed and eat a few bites before you raise your head out of bed in the morning  Eat small frequent meals throughout the day instead of large meals  Drink plenty of fluids throughout the day to stay hydrated, just don't drink a lot of fluids with your meals.  This can make your stomach fill up faster making you feel sick  Do not brush your teeth right after you eat  Products with real ginger are good for nausea, like ginger ale and ginger hard candy Make sure it says made with real ginger!  Sucking on sour candy like lemon heads is also good for nausea  If your prenatal vitamins make you nauseated, take them at night so you will sleep through the nausea  Sea Bands  If you feel like you need medicine for the nausea & vomiting please let us know  If you are unable to keep any fluids or food down please let us know    

## 2018-05-12 LAB — CYTOLOGY - PAP
CHLAMYDIA, DNA PROBE: NEGATIVE
DIAGNOSIS: NEGATIVE
Neisseria Gonorrhea: NEGATIVE

## 2018-05-12 LAB — URINE CULTURE, OB REFLEX

## 2018-05-12 LAB — CULTURE, OB URINE

## 2018-05-18 LAB — OBSTETRIC PANEL, INCLUDING HIV
Antibody Screen: NEGATIVE
BASOS ABS: 0 10*3/uL (ref 0.0–0.2)
Basos: 0 %
EOS (ABSOLUTE): 0 10*3/uL (ref 0.0–0.4)
EOS: 1 %
HEMOGLOBIN: 12.1 g/dL (ref 11.1–15.9)
HEP B S AG: NEGATIVE
HIV Screen 4th Generation wRfx: NONREACTIVE
Hematocrit: 36.9 % (ref 34.0–46.6)
IMMATURE GRANS (ABS): 0 10*3/uL (ref 0.0–0.1)
Immature Granulocytes: 0 %
Lymphocytes Absolute: 1.6 10*3/uL (ref 0.7–3.1)
Lymphs: 37 %
MCH: 30.9 pg (ref 26.6–33.0)
MCHC: 32.8 g/dL (ref 31.5–35.7)
MCV: 94 fL (ref 79–97)
MONOS ABS: 0.3 10*3/uL (ref 0.1–0.9)
Monocytes: 6 %
NEUTROS PCT: 56 %
Neutrophils Absolute: 2.4 10*3/uL (ref 1.4–7.0)
PLATELETS: 220 10*3/uL (ref 150–450)
RBC: 3.91 x10E6/uL (ref 3.77–5.28)
RDW: 13.5 % (ref 12.3–15.4)
RH TYPE: POSITIVE
RPR: NONREACTIVE
Rubella Antibodies, IGG: 21.2 index (ref >0.99)
WBC: 4.2 10*3/uL (ref 3.4–10.8)

## 2018-05-18 LAB — SMN1 COPY NUMBER ANALYSIS (SMA CARRIER SCREENING)

## 2018-05-18 LAB — CYSTIC FIBROSIS GENE TEST

## 2018-05-19 ENCOUNTER — Encounter: Payer: Self-pay | Admitting: Family Medicine

## 2018-05-19 ENCOUNTER — Encounter: Payer: Self-pay | Admitting: *Deleted

## 2018-05-25 NOTE — Progress Notes (Signed)
Pt concern address today,05/25/18 in her visit.

## 2018-06-09 ENCOUNTER — Ambulatory Visit (INDEPENDENT_AMBULATORY_CARE_PROVIDER_SITE_OTHER): Payer: BLUE CROSS/BLUE SHIELD | Admitting: Student

## 2018-06-09 VITALS — BP 110/77 | HR 60 | Wt 99.0 lb

## 2018-06-09 DIAGNOSIS — Z789 Other specified health status: Secondary | ICD-10-CM

## 2018-06-09 DIAGNOSIS — O219 Vomiting of pregnancy, unspecified: Secondary | ICD-10-CM

## 2018-06-09 DIAGNOSIS — O26892 Other specified pregnancy related conditions, second trimester: Secondary | ICD-10-CM

## 2018-06-09 DIAGNOSIS — Z3482 Encounter for supervision of other normal pregnancy, second trimester: Secondary | ICD-10-CM

## 2018-06-09 DIAGNOSIS — M545 Low back pain: Secondary | ICD-10-CM

## 2018-06-09 MED ORDER — COMFORT FIT MATERNITY SUPP SM MISC: 1.0000 [IU] | Freq: Every day | 0 refills | Status: DC | PRN

## 2018-06-09 MED ORDER — METOCLOPRAMIDE HCL 10 MG PO TABS: 10.0000 mg | ORAL_TABLET | Freq: Three times a day (TID) | ORAL | 1 refills | Status: DC | PRN

## 2018-06-09 NOTE — Patient Instructions (Signed)
Recommend pregnancy support belt/maternity support belt.  One brand is called Prenatal Cradle You may find these on Amazon.com, [employer].com, or [employer].com    Second Trimester of Pregnancy The second trimester is from week 13 through week 28, month 4 through 6. This is often the time in pregnancy that you feel your best. Often times, morning sickness has lessened or quit. You may have more energy, and you may get hungry more often. Your unborn baby (fetus) is growing rapidly. At the end of the sixth month, he or she is about 9 inches long and weighs about 1 pounds. You will likely feel the baby move (quickening) between 18 and 20 weeks of pregnancy.  Research childbirth classes and hospital preregistration at [email or web address]  Follow these instructions at home:  Avoid all smoking, herbs, and alcohol. Avoid drugs not approved by your doctor.  Do not use any tobacco products, including cigarettes, chewing tobacco, and electronic cigarettes. If you need help quitting, ask your doctor. You may get counseling or other support to help you quit.  Only take medicine as told by your doctor. Some medicines are safe and some are not during pregnancy.  Exercise only as told by your doctor. Stop exercising if you start having cramps.  Eat regular, healthy meals.  Wear a good support bra if your breasts are tender.  Do not use hot tubs, steam rooms, or saunas.  Wear your seat belt when driving.  Avoid raw meat, uncooked cheese, and liter boxes and soil used by cats.  Take your prenatal vitamins.  Take 1500-2000 milligrams of calcium daily starting at the 20th week of pregnancy until you deliver your baby.  Try taking medicine that helps you poop (stool softener) as needed, and if your doctor approves. Eat more fiber by eating fresh fruit, vegetables, and whole grains. Drink enough fluids to keep your pee (urine) clear or pale yellow.  Take warm water baths (sitz baths) to soothe pain or  discomfort caused by hemorrhoids. Use hemorrhoid cream if your doctor approves.  If you have puffy, bulging veins (varicose veins), wear support hose. Raise (elevate) your feet for 15 minutes, 3-4 times a day. Limit salt in your diet.  Avoid heavy lifting, wear low heals, and sit up straight.  Rest with your legs raised if you have leg cramps or low back pain.  Visit your dentist if you have not gone during your pregnancy. Use a soft toothbrush to brush your teeth. Be gentle when you floss.  You can have sex (intercourse) unless your doctor tells you not to.  Go to your doctor visits.  Get help if:  You feel dizzy.  You have mild cramps or pressure in your lower belly (abdomen).  You have a nagging pain in your belly area.  You continue to feel sick to your stomach (nauseous), throw up (vomit), or have watery poop (diarrhea).  You have bad smelling fluid coming from your vagina.  You have pain with peeing (urination). Get help right away if:  You have a fever.  You are leaking fluid from your vagina.  You have spotting or bleeding from your vagina.  You have severe belly cramping or pain.  You lose or gain weight rapidly.  You have trouble catching your breath and have chest pain.  You notice sudden or extreme puffiness (swelling) of your face, hands, ankles, feet, or legs.  You have not felt the baby move in over an hour.  You have severe headaches that do not go  away with medicine.  You have vision changes. This information is not intended to replace advice given to you by your health care provider. Make sure you discuss any questions you have with your health care provider. Document Released: 12/24/2009 Document Revised: 03/06/2016 Document Reviewed: 11/30/2012 Elsevier Interactive Patient Education  2017 [employer]

## 2018-06-09 NOTE — Progress Notes (Signed)
Interpreter Zara ChessJean Bosco

## 2018-06-09 NOTE — Progress Notes (Signed)
   PRENATAL VISIT NOTE  Subjective:  Sally Hampton is a 22 y.o. [id number] at 7031w3d being seen today for ongoing prenatal care.  She is currently monitored for the following issues for this low-risk pregnancy and has Language barrier; Decreased appetite; Eye lesion; History of C-section; and Supervision of normal pregnancy on their problem list.  Patient reports backache and nausea.  Contractions: Not present. Vag. Bleeding: None.   . Denies leaking of fluid.   The following portions of the patient's history were reviewed and updated as appropriate: allergies, current medications, past family history, past medical history, past social history, past surgical history and problem list. Problem list updated.  Objective:   Vitals:   06/09/18 0955  BP: 110/77  Pulse: 60  Weight: 99 lb (44.9 kg)    Fetal Status: Fetal Heart Rate (bpm): 152         General:  Alert, oriented and cooperative. Patient is in no acute distress.  Skin: Skin is warm and dry. No rash noted.   Cardiovascular: Normal heart rate noted  Respiratory: Normal respiratory effort, no problems with respiration noted  Abdomen: Soft, gravid, appropriate for gestational age.  Pain/Pressure: Present     Pelvic: Cervical exam deferred        Extremities: Normal range of motion.  Edema: None  Mental Status: Normal mood and affect. Normal behavior. Normal judgment and thought content.   Assessment and Plan:  Pregnancy: [id number] at 3731w3d  1. Encounter for supervision of other normal pregnancy in second trimester   2. Language barrier -Swahili interpreter at bedside  3. Nausea and vomiting during pregnancy prior to [redacted] weeks gestation  - metoCLOPramide (REGLAN) 10 MG tablet; Take 1 tablet (10 mg total) by mouth every 8 (eight) hours as needed for nausea.  Dispense: 30 tablet; Refill: 1  4. Low back pain during pregnancy in second trimester -Complains of low back pain specifically while working. Works at [employer] & is  required to work overtime. Was given note regarding working conditions but states they refused & continue to make her work overtime. Discussed use of maternity support belt. - Elastic Bandages & Supports (COMFORT FIT MATERNITY SUPP SM) MISC; 1 Units by Does not apply route daily as needed.  Dispense: 1 each; Refill: 0  Preterm labor symptoms and general obstetric precautions including but not limited to vaginal bleeding, contractions, leaking of fluid and fetal movement were reviewed in detail with the patient. Please refer to After Visit Summary for other counseling recommendations.  Return in about 4 weeks (around 07/07/2018) for Routine OB.  Future Appointments  Date Time Provider Department Center  07/07/2018  9:35 AM Tamera StandsWallace, Laurel S, DO [facility]  07/16/2018  9:30 AM [facility] US 1 [facility]    Judeth HornErin Adom Schoeneck, NP

## 2018-07-07 ENCOUNTER — Ambulatory Visit (INDEPENDENT_AMBULATORY_CARE_PROVIDER_SITE_OTHER): Payer: BLUE CROSS/BLUE SHIELD | Admitting: Family Medicine

## 2018-07-07 VITALS — BP 126/75 | HR 74 | Wt 104.4 lb

## 2018-07-07 DIAGNOSIS — Z3482 Encounter for supervision of other normal pregnancy, second trimester: Secondary | ICD-10-CM | POA: Diagnosis not present

## 2018-07-07 DIAGNOSIS — Z98891 History of uterine scar from previous surgery: Secondary | ICD-10-CM

## 2018-07-07 DIAGNOSIS — Z789 Other specified health status: Secondary | ICD-10-CM

## 2018-07-07 DIAGNOSIS — Z23 Encounter for immunization: Secondary | ICD-10-CM

## 2018-07-07 NOTE — Progress Notes (Signed)
   PRENATAL VISIT NOTE  Subjective:  Sally Hampton is a 22 y.o. [id number] at [redacted]w[redacted]d being seen today for ongoing prenatal care.  She is currently monitored for the following issues for this low-risk pregnancy and has Language barrier; Eye lesion; History of C-section; and Supervision of normal pregnancy on their problem list.  Patient reports no complaints.  Contractions: Not present. Vag. Bleeding: None.  Movement: Present. Denies leaking of fluid.   The following portions of the patient's history were reviewed and updated as appropriate: allergies, current medications, past family history, past medical history, past social history, past surgical history and problem list. Problem list updated.  Objective:   Vitals:   07/07/18 0949  BP: 126/75  Pulse: 74  Weight: 104 lb 6.4 oz (47.4 kg)    Fetal Status: Fetal Heart Rate (bpm): 150   Movement: Present     General:  Alert, oriented and cooperative. Patient is in no acute distress.  Skin: Skin is warm and dry. No rash noted.   Cardiovascular: Normal heart rate noted  Respiratory: Normal respiratory effort, no problems with respiration noted  Abdomen: Soft, gravid, appropriate for gestational age.  Pain/Pressure: Absent     Pelvic: Cervical exam deferred        Extremities: Normal range of motion.  Edema: None  Mental Status: Normal mood and affect. Normal behavior. Normal judgment and thought content.   Assessment and Plan:  Pregnancy: [id number] at [redacted]w[redacted]d  1. Encounter for supervision of other normal pregnancy in second trimester -- prenatal record reviewed and UTD  -- anatomy U/S scheduled for next week  -- flu shot given  2. History of C-section -- VBAC success rate estimated at 60.4% -- will need consent signed at follow-up   3. Language barrier -- Swahili telephone interpreter used for visit   Preterm labor symptoms and general obstetric precautions including but not limited to vaginal bleeding, contractions, leaking of  fluid and fetal movement were reviewed in detail with the patient. Please refer to After Visit Summary for other counseling recommendations.  Return in about 4 years (around 07/07/2022) for return LROB.  Future Appointments  Date Time Provider Department Center  07/16/2018  9:30 AM [facility] [place] 1 [facility]  08/04/2018  9:35 AM Katrinka Blazing, [place], CNM [facility]    Tamera Stands, [place]

## 2018-07-09 ENCOUNTER — Encounter (HOSPITAL_COMMUNITY): Payer: Self-pay

## 2018-07-16 ENCOUNTER — Ambulatory Visit (HOSPITAL_COMMUNITY)
Admission: RE | Admit: 2018-07-16 | Discharge: 2018-07-16 | Disposition: A | Discharge status: Home / Self Care | Payer: BLUE CROSS/BLUE SHIELD | Source: Ambulatory Visit | Attending: Internal Medicine | Admitting: Internal Medicine

## 2018-07-16 ENCOUNTER — Other Ambulatory Visit (HOSPITAL_COMMUNITY): Payer: Self-pay | Admitting: *Deleted

## 2018-07-16 DIAGNOSIS — Z3A22 22 weeks gestation of pregnancy: Secondary | ICD-10-CM | POA: Diagnosis not present

## 2018-07-16 DIAGNOSIS — O0930 Supervision of pregnancy with insufficient antenatal care, unspecified trimester: Secondary | ICD-10-CM

## 2018-07-16 DIAGNOSIS — Z3481 Encounter for supervision of other normal pregnancy, first trimester: Secondary | ICD-10-CM

## 2018-07-16 DIAGNOSIS — O358XX0 Maternal care for other (suspected) fetal abnormality and damage, not applicable or unspecified: Secondary | ICD-10-CM | POA: Diagnosis not present

## 2018-07-16 DIAGNOSIS — O34219 Maternal care for unspecified type scar from previous cesarean delivery: Secondary | ICD-10-CM

## 2018-07-16 DIAGNOSIS — Z363 Encounter for antenatal screening for malformations: Secondary | ICD-10-CM | POA: Insufficient documentation

## 2018-07-16 NOTE — MAU Note (Signed)
Pt seen in [facility] today for U/S.  Dating changed per Dr. Judeth Cornfield.  Notified Marylynn Pearson, RN in clinic to have AFP recalculated per MD request with today's U/S.

## 2018-07-19 LAB — AFP, SERUM, OPEN SPINA BIFIDA
AFP MoM: 1.11
AFP VALUE AFPOSL: 104.8 ng/mL
Gest. Age on Collection Date: 21.4 weeks
MATERNAL AGE AT EDD: 23.3 a
OSBR Risk 1 IN: 10000
Test Results:: NEGATIVE
WEIGHT: 104 [lb_av]

## 2018-08-04 ENCOUNTER — Ambulatory Visit (INDEPENDENT_AMBULATORY_CARE_PROVIDER_SITE_OTHER): Payer: BLUE CROSS/BLUE SHIELD | Admitting: Advanced Practice Midwife

## 2018-08-04 ENCOUNTER — Encounter: Payer: Self-pay | Admitting: *Deleted

## 2018-08-04 VITALS — BP 115/71 | HR 69 | Wt 110.7 lb

## 2018-08-04 DIAGNOSIS — Z789 Other specified health status: Secondary | ICD-10-CM

## 2018-08-04 DIAGNOSIS — O26812 Pregnancy related exhaustion and fatigue, second trimester: Secondary | ICD-10-CM | POA: Diagnosis not present

## 2018-08-04 DIAGNOSIS — Z98891 History of uterine scar from previous surgery: Secondary | ICD-10-CM

## 2018-08-04 DIAGNOSIS — Z3480 Encounter for supervision of other normal pregnancy, unspecified trimester: Secondary | ICD-10-CM

## 2018-08-04 NOTE — Patient Instructions (Signed)
TDaP Vaccine Pregnancy Get the Whooping Cough Vaccine While You Are Pregnant (CDC)  It is important for women to get the whooping cough vaccine in the third trimester of each pregnancy. Vaccines are the best way to prevent this disease. There are 2 different whooping cough vaccines. Both vaccines combine protection against whooping cough, tetanus and diphtheria, but they are for different age groups: Tdap: for everyone 11 years or older, including pregnant women  DTaP: for children 2 months through 6 years of age  You need the whooping cough vaccine during each of your pregnancies The recommended time to get the shot is during your 27th through 36th week of pregnancy, preferably during the earlier part of this time period. The Centers for Disease Control and Prevention (CDC) recommends that pregnant women receive the whooping cough vaccine for adolescents and adults (called Tdap vaccine) during the third trimester of each pregnancy. The recommended time to get the shot is during your 27th through 36th week of pregnancy, preferably during the earlier part of this time period. This replaces the original recommendation that pregnant women get the vaccine only if they had not previously received it. The American College of Obstetricians and Gynecologists and the American College of Nurse-Midwives support this recommendation.  You should get the whooping cough vaccine while pregnant to pass protection to your baby frame support disabled and/or not supported in this browser  Learn why Sally Hampton decided to get the whooping cough vaccine in her 3rd trimester of pregnancy and how her baby girl was born with some protection against the disease. Also available on YouTube. After receiving the whooping cough vaccine, your body will create protective antibodies (proteins produced by the body to fight off diseases) and pass some of them to your baby before birth. These antibodies provide your baby some short-term  protection against whooping cough in early life. These antibodies can also protect your baby from some of the more serious complications that come along with whooping cough. Your protective antibodies are at their highest about 2 weeks after getting the vaccine, but it takes time to pass them to your baby. So the preferred time to get the whooping cough vaccine is early in your third trimester. The amount of whooping cough antibodies in your body decreases over time. That is why CDC recommends you get a whooping cough vaccine during each pregnancy. Doing so allows each of your babies to get the greatest number of protective antibodies from you. This means each of your babies will get the best protection possible against this disease.  Getting the whooping cough vaccine while pregnant is better than getting the vaccine after you give birth Whooping cough vaccination during pregnancy is ideal so your baby will have short-term protection as soon as he is born. This early protection is important because your baby will not start getting his whooping cough vaccines until he is 2 months old. These first few months of life are when your baby is at greatest risk for catching whooping cough. This is also when he's at greatest risk for having severe, potentially life-threating complications from the infection. To avoid that gap in protection, it is best to get a whooping cough vaccine during pregnancy. You will then pass protection to your baby before he is born. To continue protecting your baby, he should get whooping cough vaccines starting at 2 months old. You may never have gotten the Tdap vaccine before and did not get it during this pregnancy. If so, you should make sure   to get the vaccine immediately after you give birth, before leaving the hospital or birthing center. It will take about 2 weeks before your body develops protection (antibodies) in response to the vaccine. Once you have protection from the vaccine,  you are less likely to give whooping cough to your newborn while caring for him. But remember, your baby will still be at risk for catching whooping cough from others. A recent study looked to see how effective Tdap was at preventing whooping cough in babies whose mothers got the vaccine while pregnant or in the hospital after giving birth. The study found that getting Tdap between 27 through 36 weeks of pregnancy is 85% more effective at preventing whooping cough in babies younger than 2 months old. Blood tests cannot tell if you need a whooping cough vaccine There are no blood tests that can tell you if you have enough antibodies in your body to protect yourself or your baby against whooping cough. Even if you have been sick with whooping cough in the past or previously received the vaccine, you still should get the vaccine during each pregnancy. Breastfeeding may pass some protective antibodies onto your baby By breastfeeding, you may pass some antibodies you have made in response to the vaccine to your baby. When you get a whooping cough vaccine during your pregnancy, you will have antibodies in your breast milk that you can share with your baby as soon as your milk comes in. However, your baby will not get protective antibodies immediately if you wait to get the whooping cough vaccine until after delivering your baby. This is because it takes about 2 weeks for your body to create antibodies. Learn more about the health benefits of breastfeeding.  

## 2018-08-04 NOTE — Progress Notes (Signed)
   PRENATAL VISIT NOTE  Subjective:  Sally Hampton is a 22 y.o. [phone] at [redacted]w[redacted]d being seen today for ongoing prenatal care.  She is currently monitored for the following issues for this low-risk pregnancy and has Language barrier; Eye lesion; History of C-section; and Supervision of normal pregnancy on their problem list.  Patient reports fatigue and poor appetite. No more N/V.  Contractions: Not present. Vag. Bleeding: None.  Movement: Present. Denies leaking of fluid.   The following portions of the patient's history were reviewed and updated as appropriate: allergies, current medications, past family history, past medical history, past social history, past surgical history and problem list. Problem list updated.  Objective:   Vitals:   08/04/18 1048  BP: 115/71  Pulse: 69  Weight: 110 lb 11.2 oz (50.2 kg)    Fetal Status: Fetal Heart Rate (bpm): 143   Movement: Present     General:  Alert, oriented and cooperative. Patient is in no acute distress.  Skin: Skin is warm and dry. No rash noted.   Cardiovascular: Normal heart rate noted  Respiratory: Normal respiratory effort, no problems with respiration noted  Abdomen: Soft, gravid, appropriate for gestational age.  Pain/Pressure: Absent     Pelvic: Cervical exam deferred        Extremities: Normal range of motion.  Edema: None  Mental Status: Normal mood and affect. Normal behavior. Normal judgment and thought content.   Assessment and Plan:  Pregnancy: [id number] at [redacted]w[redacted]d  1. Language barrier - Live Swahili interpreter used.   2. Supervision of other normal pregnancy, antepartum  3. Hx Cesarean - Strongly desires TOLAC. Consent signed. Discussed increase risk of uterine rupture after C/S 2 and that most providers will not augment labor.   Preterm labor symptoms and general obstetric precautions including but not limited to vaginal bleeding, contractions, leaking of fluid and fetal movement were reviewed in detail with the  patient. Please refer to After Visit Summary for other counseling recommendations.  Return in about 2 weeks (around 08/18/2018) for ROB/ 28 wk labs /fasting gtt.  Future Appointments  Date Time Provider Department Center  08/13/2018 10:45 AM [facility] [place] 2 [facility]    Dorathy Kinsman, CNM

## 2018-08-04 NOTE — Addendum Note (Signed)
Addended by: Dorathy Kinsman on: 08/04/2018 11:34 AM   Modules accepted: Orders

## 2018-08-05 LAB — CBC
Hematocrit: 31 % — ABNORMAL LOW (ref 34.0–46.6)
Hemoglobin: 10.5 g/dL — ABNORMAL LOW (ref 11.1–15.9)
MCH: 32.2 pg (ref 26.6–33.0)
MCHC: 33.9 g/dL (ref 31.5–35.7)
MCV: 95 fL (ref 79–97)
PLATELETS: 173 10*3/uL (ref 150–450)
RBC: 3.26 x10E6/uL — ABNORMAL LOW (ref 3.77–5.28)
RDW: 12.7 % (ref 12.3–15.4)
WBC: 6.1 10*3/uL (ref 3.4–10.8)

## 2018-08-13 ENCOUNTER — Ambulatory Visit (HOSPITAL_COMMUNITY)
Admission: RE | Admit: 2018-08-13 | Discharge: 2018-08-13 | Disposition: A | Discharge status: Home / Self Care | Payer: BLUE CROSS/BLUE SHIELD | Source: Ambulatory Visit | Attending: Internal Medicine | Admitting: Internal Medicine

## 2018-08-13 DIAGNOSIS — Z3A26 26 weeks gestation of pregnancy: Secondary | ICD-10-CM | POA: Diagnosis not present

## 2018-08-13 DIAGNOSIS — O358XX0 Maternal care for other (suspected) fetal abnormality and damage, not applicable or unspecified: Secondary | ICD-10-CM | POA: Diagnosis not present

## 2018-08-13 DIAGNOSIS — O0932 Supervision of pregnancy with insufficient antenatal care, second trimester: Secondary | ICD-10-CM | POA: Diagnosis not present

## 2018-08-13 DIAGNOSIS — Z362 Encounter for other antenatal screening follow-up: Secondary | ICD-10-CM | POA: Diagnosis not present

## 2018-08-13 DIAGNOSIS — O0930 Supervision of pregnancy with insufficient antenatal care, unspecified trimester: Secondary | ICD-10-CM

## 2018-08-13 DIAGNOSIS — O34219 Maternal care for unspecified type scar from previous cesarean delivery: Secondary | ICD-10-CM

## 2018-08-17 ENCOUNTER — Ambulatory Visit (INDEPENDENT_AMBULATORY_CARE_PROVIDER_SITE_OTHER): Payer: BLUE CROSS/BLUE SHIELD | Admitting: Obstetrics and Gynecology

## 2018-08-17 ENCOUNTER — Other Ambulatory Visit: Payer: BLUE CROSS/BLUE SHIELD

## 2018-08-17 ENCOUNTER — Encounter: Payer: Self-pay | Admitting: Family Medicine

## 2018-08-17 VITALS — BP 119/81 | HR 65 | Wt 110.6 lb

## 2018-08-17 DIAGNOSIS — Z23 Encounter for immunization: Secondary | ICD-10-CM | POA: Diagnosis not present

## 2018-08-17 DIAGNOSIS — Z3480 Encounter for supervision of other normal pregnancy, unspecified trimester: Secondary | ICD-10-CM

## 2018-08-17 MED ORDER — DOXYLAMINE SUCCINATE (SLEEP) 25 MG PO TABS: 25.0000 mg | ORAL_TABLET | Freq: Every evening | ORAL | 0 refills | Status: DC | PRN

## 2018-08-17 NOTE — Progress Notes (Signed)
   PRENATAL VISIT NOTE  Subjective:  Sally Hampton is a 22 y.o. [phone] at [redacted]w[redacted]d being seen today for ongoing prenatal care.  She is currently monitored for the following issues for this low-risk pregnancy and has Language barrier; Eye lesion; History of C-section; and Supervision of normal pregnancy on their problem list.  Patient reports no complaints.  Contractions: Not present. Vag. Bleeding: None.  Movement: Present. Denies leaking of fluid.   The following portions of the patient's history were reviewed and updated as appropriate: allergies, current medications, past family history, past medical history, past social history, past surgical history and problem list. Problem list updated.  Objective:   Vitals:   08/17/18 0829  BP: 119/81  Pulse: 65  Weight: 110 lb 9.6 oz (50.2 kg)    Fetal Status: Fetal Heart Rate (bpm): 142 Fundal Height: 26 cm Movement: Present     General:  Alert, oriented and cooperative. Patient is in no acute distress.  Skin: Skin is warm and dry. No rash noted.   Cardiovascular: Normal heart rate noted  Respiratory: Normal respiratory effort, no problems with respiration noted  Abdomen: Soft, gravid, appropriate for gestational age.  Pain/Pressure: Present     Pelvic: Cervical exam deferred        Extremities: Normal range of motion.  Edema: None  Mental Status: Normal mood and affect. Normal behavior. Normal judgment and thought content.   Assessment and Plan:  Pregnancy: [id number] at [redacted]w[redacted]d  1. Supervision of other normal pregnancy, antepartum - Tdap vaccine greater than or equal to 7yo IM - Having some insomnia. 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   Other orders - doxylamine, Sleep, (UNISOM) 25 MG tablet; Take 1 tablet (25 mg total) by mouth at bedtime as needed for sleep.  Preterm labor symptoms and general obstetric precautions including but not limited to vaginal bleeding, contractions, leaking of fluid and fetal movement were reviewed  in detail with the patient. Please refer to After Visit Summary for other counseling recommendations.  Return in about 4 weeks (around 09/14/2018).  No future appointments.  Venia Carbon, NP

## 2018-08-17 NOTE — Progress Notes (Signed)
[employer] Digital Swahili Interpreter id# [phone]

## 2018-08-17 NOTE — Patient Instructions (Signed)
Vacuna contra la difteria, el tétanos y la tosferina acelular (DTaP): Lo que debe saber  (DTaP Vaccine (Diphtheria, Tetanus, and Pertussis): What You Need to Know)  1. ¿Por qué vacunarse?  La difteria, el tétanos y la tosferina son enfermedades graves causadas por bacterias. La difteria y la tos ferina se contagian de persona a persona. El tétanos ingresa al organismo a través de cortes o heridas.  La DIFTERIA produce la formación de una membrana gruesa que cubre el fondo de la garganta.  · Puede causar problemas respiratorios, parálisis, insuficiencia cardíaca e incluso la muerte.  El TÉTANOS (trismo) provoca la contracción dolorosa de los músculos, por lo general, en todo el cuerpo.  · Puede derivar en la obstrucción de la mandíbula, lo que impide que la persona abra la boca o trague. El tétanos causa la muerte en 2 de cada 10 casos.  La PERTUSIS (tosferina) causa episodios de tos tan intensos que los bebés tienen dificultades para comer, beber y respirar. Estos ataques pueden durar semanas.  · Puede causar neumonía, convulsiones (sacudidas o la mirada perdida), daño cerebral y muerte.  La vacuna contra la difteria, el tétanos y la tosferina acelular (DTaP) puede ayudar a prevenir estas enfermedades. La mayoría de los niños que reciben la vacuna DTaP estarán protegidos durante toda la infancia. Muchos niños contraerían estas enfermedades si interrumpiésemos la vacunación.  La DTaP es una versión más segura de una vacuna anterior llamada DTP. La DTP ya no se usa en los [place].  2. ¿Quiénes y en qué momento deben recibir la vacuna DTaP?  Los niños deben recibir 5 dosis de la vacuna DTaP, una dosis en cada una de estas edades:  · 2 meses  · 4 meses  · 6 meses  · de 15 a 18 meses  · de 4 a 6 años  La DTaP puede aplicarse en el mismo momento que otras vacunas.  3. Algunos niños no deben recibir la vacuna DTaP o deben esperar  · Los niños con enfermedades menores, como un resfrío, pueden vacunarse.  Pero los niños que tienen enfermedades moderadas o graves generalmente deben esperar hasta recuperarse para poder recibir la vacuna DTaP.  · Un niño que ha tenido una reacción alérgica potencialmente mortal después de una dosis de la vacuna DTaP no debe recibir otra dosis.  · Un niño que ha sufrido una enfermedad cerebral o del sistema nervioso en el transcurso de 7 días de haber recibido una dosis de la vacuna DTaP no debe recibir otra dosis.  · Hable con el pediatra si el niño:  ? tuvo una convulsión o se desmayó después de una dosis de la vacuna DTaP,  ? lloró sin parar durante 3 horas o más después de una dosis de la vacuna DTaP,  ? tuvo fiebre de más de 105 °F después de una dosis de la vacuna DTaP.  Consulte a su médico para obtener más información. Algunos de estos niños no deben recibir otra dosis de la vacuna contra la tosferina, pero pueden recibir una vacuna que no la incluya, llamada DT.  4. Niños mayores y adultos  La vacuna DTaP no está autorizada para adolescentes, adultos y niños de 7 años en adelante.  No obstante, las personas de estas edades necesitan protección. La vacuna llamada Tdap es similar a la DTaP. Se recomienda una sola dosis de Tdap para las personas de 11 a 64 años. Otra vacuna llamada Td protege contra el tétanos y la difteria, pero no contra la tosferina. Se recomienda aplicarla cada 10 años. Hay diferentes hojas informativas sobre estas vacunas.  5. ¿Cuáles   son los riesgos de la vacuna DTaP?  Enfermarse de tétanos, difteria o tosferina es mucho más riesgoso que aplicarse la vacuna DTaP.  Sin embargo, una vacuna, al igual que cualquier medicamento, puede causar problemas serios, como reacciones alérgicas graves. El riesgo de que la vacuna DTaP cause daños graves, o la muerte, es sumamente pequeño.  Problemas leves (frecuentes)  · Fiebre (alrededor de 1 de cada 4 niños)  · Enrojecimiento o hinchazón en el lugar de la inyección (alrededor de 1 de cada 4 niños)   · Molestias o dolor con la palpación en el lugar de la inyección (alrededor de 1 de cada 4 niños)  Estos problemas son más frecuentes después de la cuarta y la quinta dosis de la serie DTaP que después de las primeras dosis. A veces, después de la cuarta o la quinta dosis de la vacuna DTaP, se hincha todo el brazo o la pierna donde se aplicó la inyección, lo que dura de 1 a 7 días (alrededor de 1 de cada 30 niños).  Entre los otros problemas leves, se incluyen los siguientes:  · Irritabilidad (alrededor de 1 de cada 3 niños)  · Cansancio o poco apetito (alrededor de 1 de cada 10 niños)  · Vómitos (alrededor de 1 de cada 50 niños)  En general, estos problemas aparecen de 1 a 3 días después de la inyección.  Problemas moderados (poco frecuentes)  · Convulsiones (sacudidas o mirada perdida) (alrededor de 1 de cada 14 000 niños)  · Llanto incesante durante 3 horas o más (alrededor de 1 de cada 1000 niños)  · Fiebre alta, superior a 105 °F (alrededor de 1 de cada 16 000 niños)  Problemas graves (muy poco frecuentes)  · Reacción alérgica grave (menos de 1 caso cada un millón de dosis)  · Se han informado otros diversos problemas graves después de la vacuna DTaP. Estos incluyen:  ? Convulsiones crónicas, coma o disminución de la conciencia.  ? Lesiones cerebrales permanentes.  Estos son tan poco frecuentes que es difícil determinar si la causa es la vacuna.  El control de la fiebre es especialmente importante en los niños que han tenido convulsiones, cualquiera sea el motivo. También es importante si otro familiar ha tenido convulsiones. Puede disminuir la fiebre y el dolor si le da al niño un analgésico sin aspirina en el momento de la aplicación y durante las 24 horas siguientes, según las indicaciones del envase.  6. ¿Qué pasa si hay una reacción grave?  ¿A qué signos debo estar atento?  · Observe todo lo que le preocupe, como signos de una reacción alérgica grave, fiebre muy alta o cambios en el comportamiento.   Los signos de una reacción alérgica grave pueden incluir ronchas, hinchazón de la cara y la garganta, dificultad para respirar, latidos cardíacos acelerados, mareos y debilidad. Pueden comenzar entre unos pocos minutos y algunas horas después de la vacunación.  ¿Qué debo hacer?  · Si usted piensa que se trata de una reacción alérgica grave o de otra emergencia que no puede esperar, llame al 911 o diríjase al hospital más cercano. Sino, llame a su médico.  · Después, la reacción debe informarse al Sistema de Información sobre Efectos Adversos de las Vacunas (Vaccine Adverse Event Reporting System, VAERS). Su médico puede presentar este informe, o puede hacerlo usted mismo a través del sitio web de VAERS, en www.vaers.hhs.gov, o llamando al 1-800-822-7967.  El VAERS es solo para informar reacciones. No brindan consejo médico.  7. Programa Nacional de Compensación de Daños por Vacunas  El Programa Nacional de Compensación de Daños por Vacunas (National Vaccine Injury Compensation Program, VICP) es un programa federal que fue creado   para compensar a las personas que puedan haber sufrido daños al recibir ciertas vacunas.  Aquellas personas que consideren que han sufrido un daño como consecuencia de una vacuna y quieren saber más acerca del programa y cómo presentar una denuncia, pueden llamar al 1-800-338-2382 o visitar su sitio web en www.hrsa.gov/vaccinecompensation.  8. ¿Cómo puedo obtener más información?  · Consulte a su médico.  · Comuníquese con el servicio de salud de su localidad o su estado.  · Comuníquese con los Centros para el Control y la Prevención de Enfermedades (Centers for Disease Control and Prevention , CDC).  ? Llame al 1-800-232-4636 (1-800-CDC-INFO), o  ? Visite la página web de los CDC en www.cdc.gov/vaccines  Declaración de información sobre la vacuna contra la difteria, el tétanos y la tosferina acelular (DTaP) de los CDC (02/26/06)   Esta información no tiene como fin reemplazar el consejo del médico. Asegúrese de hacerle al médico cualquier pregunta que tenga.  Document Released: 12/26/2008 Document Revised: 10/20/2014 Document Reviewed: 11/10/2013  Elsevier Interactive Patient Education © 2017 Elsevier Inc.

## 2018-08-18 LAB — CBC
HEMATOCRIT: 35.3 % (ref 34.0–46.6)
HEMOGLOBIN: 11.6 g/dL (ref 11.1–15.9)
MCH: 31.3 pg (ref 26.6–33.0)
MCHC: 32.9 g/dL (ref 31.5–35.7)
MCV: 95 fL (ref 79–97)
Platelets: 221 10*3/uL (ref 150–450)
RBC: 3.71 x10E6/uL — AB (ref 3.77–5.28)
RDW: 12.9 % (ref 12.3–15.4)
WBC: 6.3 10*3/uL (ref 3.4–10.8)

## 2018-08-18 LAB — HIV ANTIBODY (ROUTINE TESTING W REFLEX): HIV Screen 4th Generation wRfx: NONREACTIVE

## 2018-08-18 LAB — GLUCOSE TOLERANCE, 2 HOURS W/ 1HR
Glucose, 1 hour: 78 mg/dL (ref 65–179)
Glucose, 2 hour: 77 mg/dL (ref 65–152)
Glucose, Fasting: 71 mg/dL (ref 65–91)

## 2018-08-18 LAB — RPR: RPR: NONREACTIVE

## 2018-09-15 ENCOUNTER — Ambulatory Visit (INDEPENDENT_AMBULATORY_CARE_PROVIDER_SITE_OTHER): Payer: BLUE CROSS/BLUE SHIELD | Admitting: Nurse Practitioner

## 2018-09-15 VITALS — BP 120/79 | HR 65 | Wt 119.5 lb

## 2018-09-15 DIAGNOSIS — Z3480 Encounter for supervision of other normal pregnancy, unspecified trimester: Secondary | ICD-10-CM

## 2018-09-15 DIAGNOSIS — Z3483 Encounter for supervision of other normal pregnancy, third trimester: Secondary | ICD-10-CM

## 2018-09-15 MED ORDER — VITAFOL GUMMIES 3.33-0.333-34.8 MG PO CHEW: 3.0000 | CHEWABLE_TABLET | Freq: Every day | ORAL | 11 refills | Status: DC

## 2018-09-15 NOTE — Progress Notes (Signed)
    Subjective:  Sally Hampton is a 22 y.o. [phone] at 6040w3d being seen today for ongoing prenatal care.  She is currently monitored for the following issues for this low-risk pregnancy and has Language barrier; Eye lesion; History of C-section; and Supervision of normal pregnancy on their problem list.  Patient reports no complaints.  Contractions: Not present. Vag. Bleeding: None.  Movement: Present. Denies leaking of fluid.   The following portions of the patient's history were reviewed and updated as appropriate: allergies, current medications, past family history, past medical history, past social history, past surgical history and problem list. Problem list updated.  Objective:   Vitals:   09/15/18 0919  BP: 120/79  Pulse: 65  Weight: 119 lb 8 oz (54.2 kg)    Fetal Status: Fetal Heart Rate (bpm): 134 Fundal Height: 32 cm Movement: Present     General:  Alert, oriented and cooperative. Patient is in no acute distress.  Skin: Skin is warm and dry. No rash noted.   Cardiovascular: Normal heart rate noted  Respiratory: Normal respiratory effort, no problems with respiration noted  Abdomen: Soft, gravid, appropriate for gestational age. Pain/Pressure: Present     Pelvic:  Cervical exam deferred        Extremities: Normal range of motion.  Edema: None  Mental Status: Normal mood and affect. Normal behavior. Normal judgment and thought content.   Urinalysis:      Assessment and Plan:  Pregnancy: [id number] at 2340w3d  1. Supervision of other normal pregnancy, antepartum  Video interpreter used for the entire visit Discussed her note to be out for maternity leave.  Will check at work and see if FMLA papers are needed.  Works at [employer].  Discussed medical reason for leave vs personal decision to stop work.  Will discuss at next visit.  Preterm labor symptoms and general obstetric precautions including but not limited to vaginal bleeding, contractions, leaking of fluid  and fetal movement were reviewed in detail with the patient. Please refer to After Visit Summary for other counseling recommendations.  Return in about 2 weeks (around 09/29/2018).  Sally BernheimERRI Alysa Duca, RN, MSN, NP-BC Nurse Practitioner, [facility], [facility] 09/15/2018 9:53 AM

## 2018-09-28 ENCOUNTER — Encounter: Payer: Self-pay | Admitting: Family Medicine

## 2018-09-28 ENCOUNTER — Ambulatory Visit (INDEPENDENT_AMBULATORY_CARE_PROVIDER_SITE_OTHER): Payer: BLUE CROSS/BLUE SHIELD | Admitting: Student

## 2018-09-28 DIAGNOSIS — Z3480 Encounter for supervision of other normal pregnancy, unspecified trimester: Secondary | ICD-10-CM

## 2018-09-28 DIAGNOSIS — Z3483 Encounter for supervision of other normal pregnancy, third trimester: Secondary | ICD-10-CM

## 2018-09-28 NOTE — Patient Instructions (Signed)

## 2018-09-28 NOTE — Progress Notes (Signed)
   PRENATAL VISIT NOTE  Subjective:  Sally KaufmanRachel Ladouceur is a 22 y.o. [phone] at 2592w2d being seen today for ongoing prenatal care.  She is currently monitored for the following issues for this low-risk pregnancy and has Language barrier; Eye lesion; History of C-section; and Supervision of normal pregnancy on their problem list.  Patient reports backache in the middle of her lower back. She thinks this is because she is standing a lot at work. She would like a work note that says she can be switched to a job where she is walking. .  Contractions: Not present. Vag. Bleeding: None.  Movement: Present. Denies leaking of fluid.   The following portions of the patient's history were reviewed and updated as appropriate: allergies, current medications, past family history, past medical history, past social history, past surgical history and problem list. Problem list updated.  Objective:   Vitals:   09/28/18 1005  BP: 119/78  Pulse: 73  Weight: 118 lb 12.8 oz (53.9 kg)    Fetal Status: Fetal Heart Rate (bpm): 144 Fundal Height: 33 cm Movement: Present     General:  Alert, oriented and cooperative. Patient is in no acute distress.  Skin: Skin is warm and dry. No rash noted.   Cardiovascular: Normal heart rate noted  Respiratory: Normal respiratory effort, no problems with respiration noted  Abdomen: Soft, gravid, appropriate for gestational age.  Pain/Pressure: Present     Pelvic: Cervical exam deferred        Extremities: Normal range of motion.     Mental Status: Normal mood and affect. Normal behavior. Normal judgment and thought content.   Assessment and Plan:  Pregnancy: [id number] at 5292w2d  1. Supervision of other normal pregnancy, antepartum -Will give work note that says that patient can be in a job where she is walking around -Reviewed that back ache is normal in pregnancy;  -She does not want to talk about [facility] today  Preterm labor symptoms and general obstetric precautions including  but not limited to vaginal bleeding, contractions, leaking of fluid and fetal movement were reviewed in detail with the patient. Please refer to After Visit Summary for other counseling recommendations.  No follow-ups on file.  No future appointments.  Marylene LandKathryn Lorraine Kooistra, CNM

## 2018-10-11 ENCOUNTER — Telehealth: Payer: Self-pay | Admitting: Obstetrics and Gynecology

## 2018-10-11 NOTE — Telephone Encounter (Signed)
Attempted to call pt with appt time change. No answer and VM not setup. Will call again on 10/12/18.

## 2018-10-12 ENCOUNTER — Telehealth: Payer: Self-pay | Admitting: Obstetrics and Gynecology

## 2018-10-12 NOTE — Telephone Encounter (Signed)
2nd attempt to reach patient in regards to appt time change. No answer and VM is not setup.

## 2018-10-14 ENCOUNTER — Ambulatory Visit (INDEPENDENT_AMBULATORY_CARE_PROVIDER_SITE_OTHER): Payer: BLUE CROSS/BLUE SHIELD | Admitting: Obstetrics and Gynecology

## 2018-10-14 ENCOUNTER — Encounter: Payer: BLUE CROSS/BLUE SHIELD | Admitting: Obstetrics and Gynecology

## 2018-10-14 VITALS — BP 126/81 | HR 64 | Wt 120.2 lb

## 2018-10-14 DIAGNOSIS — Z3480 Encounter for supervision of other normal pregnancy, unspecified trimester: Secondary | ICD-10-CM

## 2018-10-14 DIAGNOSIS — Z98891 History of uterine scar from previous surgery: Secondary | ICD-10-CM

## 2018-10-14 DIAGNOSIS — Z3483 Encounter for supervision of other normal pregnancy, third trimester: Secondary | ICD-10-CM

## 2018-10-14 NOTE — Progress Notes (Signed)
   PRENATAL VISIT NOTE  Subjective:  Sally Hampton is a 23 y.o. [id number] at [redacted]w[redacted]d being seen today for ongoing prenatal care.  She is currently monitored for the following issues for this low-risk pregnancy and has Language barrier; Eye lesion; History of C-section; and Supervision of normal pregnancy on their problem list.  Patient reports no complaints.  Contractions: Irritability. Vag. Bleeding: None.  Movement: Present. Denies leaking of fluid.   The following portions of the patient's history were reviewed and updated as appropriate: allergies, current medications, past family history, past medical history, past social history, past surgical history and problem list. Problem list updated.  Objective:   Vitals:   10/14/18 1322  BP: 126/81  Pulse: 64  Weight: 120 lb 3.2 oz (54.5 kg)    Fetal Status: Fetal Heart Rate (bpm): 145 Fundal Height: 35 cm Movement: Present     General:  Alert, oriented and cooperative. Patient is in no acute distress.  Skin: Skin is warm and dry. No rash noted.   Cardiovascular: Normal heart rate noted  Respiratory: Normal respiratory effort, no problems with respiration noted  Abdomen: Soft, gravid, appropriate for gestational age.  Pain/Pressure: Present     Pelvic: Cervical exam deferred        Extremities: Normal range of motion.  Edema: Trace  Mental Status: Normal mood and affect. Normal behavior. Normal judgment and thought content.   Assessment and Plan:  Pregnancy: [id number] at [redacted]w[redacted]d  1. Supervision of other normal pregnancy, antepartum  - Doing well. Wants FMLA paper work filled out, however does not have it with her today. Discussed that FMLA paper work will come from them and then she will need to bring it here to have it filled out.   2. History of C-section  - TOLAC signed   There are no diagnoses linked to this encounter. Preterm labor symptoms and general obstetric precautions including but not limited to vaginal bleeding,  contractions, leaking of fluid and fetal movement were reviewed in detail with the patient. Please refer to After Visit Summary for other counseling recommendations.  Return in about 1 week (around 10/21/2018) for GBS at next visit. .  Future Appointments  Date Time Provider Department Center  10/20/2018  1:35 PM Marny Lowenstein, PA-C [facility]    Venia Carbon, NP

## 2018-10-20 ENCOUNTER — Other Ambulatory Visit (HOSPITAL_COMMUNITY)
Admission: RE | Admit: 2018-10-20 | Discharge: 2018-10-20 | Disposition: A | Discharge status: Home / Self Care | Payer: BLUE CROSS/BLUE SHIELD | Source: Ambulatory Visit | Attending: Medical | Admitting: Medical

## 2018-10-20 ENCOUNTER — Ambulatory Visit (INDEPENDENT_AMBULATORY_CARE_PROVIDER_SITE_OTHER): Payer: BLUE CROSS/BLUE SHIELD | Admitting: Medical

## 2018-10-20 ENCOUNTER — Encounter: Payer: Self-pay | Admitting: Medical

## 2018-10-20 VITALS — BP 111/75 | HR 74 | Wt 120.9 lb

## 2018-10-20 DIAGNOSIS — Z3480 Encounter for supervision of other normal pregnancy, unspecified trimester: Secondary | ICD-10-CM | POA: Insufficient documentation

## 2018-10-20 DIAGNOSIS — Z98891 History of uterine scar from previous surgery: Secondary | ICD-10-CM

## 2018-10-20 DIAGNOSIS — Z3A36 36 weeks gestation of pregnancy: Secondary | ICD-10-CM

## 2018-10-20 DIAGNOSIS — Z3483 Encounter for supervision of other normal pregnancy, third trimester: Secondary | ICD-10-CM

## 2018-10-20 NOTE — Progress Notes (Signed)
   PRENATAL VISIT NOTE  Subjective:  Sally Hampton is a 23 y.o. [phone] at [redacted]w[redacted]d being seen today for ongoing prenatal care.  She is currently monitored for the following issues for this low-risk pregnancy and has Language barrier; Eye lesion; History of C-section; and Supervision of normal pregnancy on their problem list.  Patient reports no complaints.  Contractions: Not present. Vag. Bleeding: None.  Movement: Present. Denies leaking of fluid.   The following portions of the patient's history were reviewed and updated as appropriate: allergies, current medications, past family history, past medical history, past social history, past surgical history and problem list. Problem list updated.  Objective:   Vitals:   10/20/18 1359  BP: 111/75  Pulse: 74  Weight: 120 lb 14.4 oz (54.8 kg)    Fetal Status: Fetal Heart Rate (bpm): 136 Fundal Height: 38 cm Movement: Present  Presentation: Vertex  General:  Alert, oriented and cooperative. Patient is in no acute distress.  Skin: Skin is warm and dry. No rash noted.   Cardiovascular: Normal heart rate noted  Respiratory: Normal respiratory effort, no problems with respiration noted  Abdomen: Soft, gravid, appropriate for gestational age.  Pain/Pressure: Present     Pelvic: Cervical exam performed Dilation: Fingertip Effacement (%): 50 Station: -2  Extremities: Normal range of motion.  Edema: None  Mental Status: Normal mood and affect. Normal behavior. Normal judgment and thought content.   Assessment and Plan:  Pregnancy: [id number] at [redacted]w[redacted]d  1. Supervision of other normal pregnancy, antepartum - GC/Chlamydia probe amp ([place])not at [facility] - Culture, beta strep (group b only)  2. History of C-section - Planning TOLAC, consent signed previously   Preterm labor symptoms and general obstetric precautions including but not limited to vaginal bleeding, contractions, leaking of fluid and fetal movement were reviewed in detail with the  patient. Please refer to After Visit Summary for other counseling recommendations.  Return in about 1 week (around 10/27/2018) for LOB.  Vonzella Nipple, PA-C

## 2018-10-20 NOTE — Patient Instructions (Signed)
Fetal Movement Counts Patient Name: ________________________________________________ Patient Due Date: ____________________ What is a fetal movement count?  A fetal movement count is the number of times that you feel your baby move during a certain amount of time. This may also be called a fetal kick count. A fetal movement count is recommended for every pregnant woman. You may be asked to start counting fetal movements as early as week 28 of your pregnancy. Pay attention to when your baby is most active. You may notice your baby's sleep and wake cycles. You may also notice things that make your baby move more. You should do a fetal movement count:  When your baby is normally most active.  At the same time each day. A good time to count movements is while you are resting, after having something to eat and drink. How do I count fetal movements? 1. Find a quiet, comfortable area. Sit, or lie down on your side. 2. Write down the date, the start time and stop time, and the number of movements that you felt between those two times. Take this information with you to your health care visits. 3. For 2 hours, count kicks, flutters, swishes, rolls, and jabs. You should feel at least 10 movements during 2 hours. 4. You may stop counting after you have felt 10 movements. 5. If you do not feel 10 movements in 2 hours, have something to eat and drink. Then, keep resting and counting for 1 hour. If you feel at least 4 movements during that hour, you may stop counting. Contact a health care provider if:  You feel fewer than 4 movements in 2 hours.  Your baby is not moving like he or she usually does. Date: ____________ Start time: ____________ Stop time: ____________ Movements: ____________ Date: ____________ Start time: ____________ Stop time: ____________ Movements: ____________ Date: ____________ Start time: ____________ Stop time: ____________ Movements: ____________ Date: ____________ Start time:  ____________ Stop time: ____________ Movements: ____________ Date: ____________ Start time: ____________ Stop time: ____________ Movements: ____________ Date: ____________ Start time: ____________ Stop time: ____________ Movements: ____________ Date: ____________ Start time: ____________ Stop time: ____________ Movements: ____________ Date: ____________ Start time: ____________ Stop time: ____________ Movements: ____________ Date: ____________ Start time: ____________ Stop time: ____________ Movements: ____________ This information is not intended to replace advice given to you by your health care provider. Make sure you discuss any questions you have with your health care provider. Document Released: 10/29/2006 Document Revised: 05/28/2016 Document Reviewed: 11/08/2015 Elsevier Interactive Patient Education  2019 Else[employer] Braxton Hicks Contractions Contractions of the uterus can occur throughout pregnancy, but they are not always a sign that you are in labor. You may have practice contractions called Braxton Hicks contractions. These false labor contractions are sometimes confused with true labor. What are Braxton Hicks contractions? Braxton Hicks contractions are tightening movements that occur in the muscles of the uterus before labor. Unlike true labor contractions, these contractions do not result in opening (dilation) and thinning of the cervix. Toward the end of pregnancy (32-34 weeks), Braxton Hicks contractions can happen more often and may become stronger. These contractions are sometimes difficult to tell apart from true labor because they can be very uncomfortable. You should not feel embarrassed if you go to the hospital with false labor. Sometimes, the only way to tell if you are in true labor is for your health care provider to look for changes in the cervix. The health care provider will do a physical exam and may monitor your contractions. If   you are not in true labor, the exam  should show that your cervix is not dilating and your water has not broken. If there are no other health problems associated with your pregnancy, it is completely safe for you to be sent home with false labor. You may continue to have Braxton Hicks contractions until you go into true labor. How to tell the difference between true labor and false labor True labor  Contractions last 30-70 seconds.  Contractions become very regular.  Discomfort is usually felt in the top of the uterus, and it spreads to the lower abdomen and low back.  Contractions do not go away with walking.  Contractions usually become more intense and increase in frequency.  The cervix dilates and gets thinner. False labor  Contractions are usually shorter and not as strong as true labor contractions.  Contractions are usually irregular.  Contractions are often felt in the front of the lower abdomen and in the groin.  Contractions may go away when you walk around or change positions while lying down.  Contractions get weaker and are shorter-lasting as time goes on.  The cervix usually does not dilate or become thin. Follow these instructions at home:   Take over-the-counter and prescription medicines only as told by your health care provider.  Keep up with your usual exercises and follow other instructions from your health care provider.  Eat and drink lightly if you think you are going into labor.  If Braxton Hicks contractions are making you uncomfortable: ? Change your position from lying down or resting to walking, or change from walking to resting. ? Sit and rest in a tub of warm water. ? Drink enough fluid to keep your urine pale yellow. Dehydration may cause these contractions. ? Do slow and deep breathing several times an hour.  Keep all follow-up prenatal visits as told by your health care provider. This is important. Contact a health care provider if:  You have a fever.  You have continuous  pain in your abdomen. Get help right away if:  Your contractions become stronger, more regular, and closer together.  You have fluid leaking or gushing from your vagina.  You pass blood-tinged mucus (bloody show).  You have bleeding from your vagina.  You have low back pain that you never had before.  You feel your baby's head pushing down and causing pelvic pressure.  Your baby is not moving inside you as much as it used to. Summary  Contractions that occur before labor are called Braxton Hicks contractions, false labor, or practice contractions.  Braxton Hicks contractions are usually shorter, weaker, farther apart, and less regular than true labor contractions. True labor contractions usually become progressively stronger and regular, and they become more frequent.  Manage discomfort from Braxton Hicks contractions by changing position, resting in a warm bath, drinking plenty of water, or practicing deep breathing. This information is not intended to replace advice given to you by your health care provider. Make sure you discuss any questions you have with your health care provider. Document Released: 02/12/2017 Document Revised: 07/14/2017 Document Reviewed: 02/12/2017 Elsevier Interactive Patient Education  2019 Elsevier Inc.  

## 2018-10-21 LAB — GC/CHLAMYDIA PROBE AMP (~~LOC~~) NOT AT ARMC
Chlamydia: NEGATIVE
NEISSERIA GONORRHEA: NEGATIVE

## 2018-10-24 LAB — CULTURE, BETA STREP (GROUP B ONLY): STREP GP B CULTURE: NEGATIVE

## 2018-10-27 ENCOUNTER — Encounter: Payer: BLUE CROSS/BLUE SHIELD | Admitting: Nurse Practitioner

## 2018-10-27 ENCOUNTER — Other Ambulatory Visit: Payer: Self-pay

## 2018-10-27 ENCOUNTER — Ambulatory Visit (INDEPENDENT_AMBULATORY_CARE_PROVIDER_SITE_OTHER): Payer: BLUE CROSS/BLUE SHIELD | Admitting: Nurse Practitioner

## 2018-10-27 ENCOUNTER — Inpatient Hospital Stay (HOSPITAL_COMMUNITY)
Admission: AD | Admit: 2018-10-27 | Discharge: 2018-10-27 | Disposition: A | Discharge status: Home / Self Care | Payer: BLUE CROSS/BLUE SHIELD | Source: Ambulatory Visit | Attending: Obstetrics and Gynecology | Admitting: Obstetrics and Gynecology

## 2018-10-27 ENCOUNTER — Inpatient Hospital Stay (HOSPITAL_BASED_OUTPATIENT_CLINIC_OR_DEPARTMENT_OTHER): Payer: BLUE CROSS/BLUE SHIELD

## 2018-10-27 ENCOUNTER — Encounter (HOSPITAL_COMMUNITY): Payer: Self-pay | Admitting: *Deleted

## 2018-10-27 ENCOUNTER — Encounter: Payer: Self-pay | Admitting: Nurse Practitioner

## 2018-10-27 VITALS — BP 127/80 | HR 66 | Wt 125.1 lb

## 2018-10-27 DIAGNOSIS — O368130 Decreased fetal movements, third trimester, not applicable or unspecified: Secondary | ICD-10-CM

## 2018-10-27 DIAGNOSIS — Z3A37 37 weeks gestation of pregnancy: Secondary | ICD-10-CM | POA: Diagnosis not present

## 2018-10-27 DIAGNOSIS — R030 Elevated blood-pressure reading, without diagnosis of hypertension: Secondary | ICD-10-CM | POA: Insufficient documentation

## 2018-10-27 DIAGNOSIS — O358XX0 Maternal care for other (suspected) fetal abnormality and damage, not applicable or unspecified: Secondary | ICD-10-CM

## 2018-10-27 DIAGNOSIS — Z3480 Encounter for supervision of other normal pregnancy, unspecified trimester: Secondary | ICD-10-CM

## 2018-10-27 DIAGNOSIS — Z3689 Encounter for other specified antenatal screening: Secondary | ICD-10-CM

## 2018-10-27 DIAGNOSIS — O34219 Maternal care for unspecified type scar from previous cesarean delivery: Secondary | ICD-10-CM | POA: Diagnosis not present

## 2018-10-27 DIAGNOSIS — O26893 Other specified pregnancy related conditions, third trimester: Secondary | ICD-10-CM | POA: Insufficient documentation

## 2018-10-27 DIAGNOSIS — O169 Unspecified maternal hypertension, unspecified trimester: Secondary | ICD-10-CM | POA: Diagnosis not present

## 2018-10-27 DIAGNOSIS — Z3483 Encounter for supervision of other normal pregnancy, third trimester: Secondary | ICD-10-CM

## 2018-10-27 DIAGNOSIS — Z98891 History of uterine scar from previous surgery: Secondary | ICD-10-CM

## 2018-10-27 DIAGNOSIS — O368190 Decreased fetal movements, unspecified trimester, not applicable or unspecified: Secondary | ICD-10-CM

## 2018-10-27 DIAGNOSIS — Z789 Other specified health status: Secondary | ICD-10-CM

## 2018-10-27 LAB — COMPREHENSIVE METABOLIC PANEL
ALT: 10 U/L (ref 0–44)
AST: 25 U/L (ref 15–41)
Albumin: 2.9 g/dL — ABNORMAL LOW (ref 3.5–5.0)
Alkaline Phosphatase: 161 U/L — ABNORMAL HIGH (ref 38–126)
Anion gap: 5 (ref 5–15)
BUN: 8 mg/dL (ref 6–20)
CHLORIDE: 109 mmol/L (ref 98–111)
CO2: 19 mmol/L — ABNORMAL LOW (ref 22–32)
Calcium: 7.9 mg/dL — ABNORMAL LOW (ref 8.9–10.3)
Creatinine, Ser: 0.6 mg/dL (ref 0.44–1.00)
GFR calc Af Amer: >60 mL/min (ref >60)
Glucose, Bld: 99 mg/dL (ref 70–99)
Potassium: 3.9 mmol/L (ref 3.5–5.1)
Sodium: 133 mmol/L — ABNORMAL LOW (ref 135–145)
Total Bilirubin: 0.7 mg/dL (ref 0.3–1.2)
Total Protein: 6.1 g/dL — ABNORMAL LOW (ref 6.5–8.1)

## 2018-10-27 LAB — CBC
HCT: 35.8 % — ABNORMAL LOW (ref 36.0–46.0)
Hemoglobin: 12.1 g/dL (ref 12.0–15.0)
MCH: 32.3 pg (ref 26.0–34.0)
MCHC: 33.8 g/dL (ref 30.0–36.0)
MCV: 95.5 fL (ref 80.0–100.0)
Platelets: 188 10*3/uL (ref 150–400)
RBC: 3.75 MIL/uL — AB (ref 3.87–5.11)
RDW: 12.7 % (ref 11.5–15.5)
WBC: 6.1 10*3/uL (ref 4.0–10.5)
nRBC: 0 % (ref 0.0–0.2)

## 2018-10-27 LAB — PROTEIN / CREATININE RATIO, URINE
Creatinine, Urine: 228 mg/dL
Protein Creatinine Ratio: 0.1 mg/mg{Cre} (ref 0.00–0.15)
Total Protein, Urine: 22 mg/dL

## 2018-10-27 NOTE — MAU Provider Note (Signed)
History     CSN: [id number]  Arrival date and time: 10/27/18 1707   First Provider Initiated Contact with Patient 10/27/18 1745      Chief Complaint  Patient presents with  . Decreased Fetal Movement   [id number] @37 .3 wks presenting from clinic for decreased FM and elevated BP. Reports decreased FM x1 week. BP elevated for first time today in clinic. Denies HA, visual disturbances, RUQ pain, SOB, and CP. Denies VB, LOF, and ctx.    OB History    Gravida  4   Para  2   Term  2   Preterm      AB  1   Living  2     SAB  1   TAB      Ectopic      Multiple      Live Births  2           History reviewed. No pertinent past medical history.  Past Surgical History:  Procedure Laterality Date  . CESAREAN SECTION      History reviewed. No pertinent family history.  Social History   Tobacco Use  . Smoking status: Never Smoker  . Smokeless tobacco: Never Used  Substance Use Topics  . Alcohol use: Not Currently    Alcohol/week: 0.0 standard drinks    Comment: not in pregnancy  . Drug use: Never    Allergies: No Known Allergies  No medications prior to admission.    Review of Systems  Eyes: Negative for visual disturbance.  Gastrointestinal: Negative for abdominal pain.  Genitourinary: Negative for vaginal bleeding.  Neurological: Negative for headaches.   Physical Exam   Blood pressure 121/78, pulse 70, resp. rate 18, last menstrual period 02/28/2018. Patient Vitals for the past 24 hrs:  BP Pulse Resp  10/27/18 1800 121/78 70 18  10/27/18 1730 128/83 68 -  10/27/18 1722 114/84 66 -   Physical Exam  Nursing note and vitals reviewed. Constitutional: She is oriented to person, place, and time. She appears well-developed and well-nourished. No distress.  HENT:  Head: Normocephalic and atraumatic.  Neck: Normal range of motion.  Cardiovascular: Normal rate.  Respiratory: Effort normal. No respiratory distress.  Musculoskeletal: Normal range of  motion.        General: No edema.  Neurological: She is alert and oriented to person, place, and time.  Skin: Skin is warm and dry.  Psychiatric: She has a normal mood and affect.  EFM: 125 bpm, mod variability, + accels, no decels Toco: irregular, rare  MAU Course  Procedures Orders Placed This Encounter  Procedures  . [place] MFM FETAL BPP WO NON STRESS    Standing Status:   Standing    Number of Occurrences:   1    Order Specific Question:   What location should the exam be performed?    Answer:   [facility]    Order Specific Question:   Symptom/Reason for Exam    Answer:   Decreased fetal movement [id number]  . CBC    Standing Status:   Standing    Number of Occurrences:   1  . Comprehensive metabolic panel    Standing Status:   Standing    Number of Occurrences:   1  . Protein / creatinine ratio, urine    Standing Status:   Standing    Number of Occurrences:   1  . Discharge patient    Order Specific Question:   Discharge disposition    Answer:  01-Home or Self Care [1]    Order Specific Question:   Discharge patient date    Answer:   10/27/2018   MDM Labs and BPP ordered and reviewed. BPP 8/8, AFI 21 cm. No evidence of PEC. Stable for discharge home.   Assessment and Plan   1. Elevated blood pressure affecting pregnancy, antepartum   2. Decreased fetal movement   3. [redacted] weeks gestation of pregnancy   4. NST (non-stress test) reactive    [facility] PEC precautions FMCs  Allergies as of 10/27/2018   No Known Allergies     Medication List    STOP taking these medications   doxylamine (Sleep) 25 MG tablet Commonly known as:  UNISOM   metoCLOPramide 10 MG tablet Commonly known as:  REGLAN     TAKE these medications   COMFORT FIT MATERNITY SUPP SM Misc 1 Units by Does not apply route daily as needed.   VITAFOL GUMMIES 3.33-0.333-34.8 MG Chew Chew 3 each by mouth daily.      Donette LarryMelanie Sender Rueb,  CNM 10/27/2018, 8:03 PM

## 2018-10-27 NOTE — MAU Note (Signed)
Patient sent up from clinic for decreased FM and elevated BP x1.  Pt reports last feeling movement 3 hours ago.  BP in triage 114/84.  Denies HA, visual changes.

## 2018-10-27 NOTE — Progress Notes (Signed)
    Subjective:  Sally Hampton is a 23 y.o. [phone] at [redacted]w[redacted]d being seen today for ongoing prenatal care.  She is currently monitored for the following issues for this high-risk pregnancy and has Language barrier; Eye lesion; History of C-section; and Supervision of normal pregnancy on their problem list.  Patient reports decreased fetal movement for awhile.  Contractions: Not present. Vag. Bleeding: None.  Movement: Present. Denies leaking of fluid.   The following portions of the patient's history were reviewed and updated as appropriate: allergies, current medications, past family history, past medical history, past social history, past surgical history and problem list. Problem list updated.  Objective:   Vitals:   10/27/18 1629 10/27/18 1656  BP: 133/90 127/80  Pulse: 66   Weight: 125 lb 1.6 oz (56.7 kg)     Fetal Status: Fetal Heart Rate (bpm): 133   Movement: Present     General:  Alert, oriented and cooperative. Patient is in no acute distress.  Skin: Skin is warm and dry. No rash noted.   Cardiovascular: Normal heart rate noted  Respiratory: Normal respiratory effort, no problems with respiration noted  Abdomen: Soft, gravid, appropriate for gestational age. Pain/Pressure: Present     Pelvic:  Cervical exam deferred        Extremities: Normal range of motion.  Edema: Trace  Mental Status: Normal mood and affect. Normal behavior. Normal judgment and thought content.   Urinalysis:      Assessment and Plan:  Pregnancy: [id number] at [redacted]w[redacted]d  1. Supervision of other normal pregnancy, antepartum   2. History of C-section Plans VBAC and consent signed  3. Language barrier Language line used for the entire visit  4. Elevated BP without diagnosis of hypertension Repeat BP was less; scheduled nurse visit for BP check on thursday  5. Decreased fetal movements in third trimester, single or unspecified fetus Discussed care with Dr. Aneta Mins.  Will send to MAU for monitoring  for decreased movement.  Called report to MAU - advised of borderline elevation of BP and 5 pound weight gain in one week.    Term labor symptoms and general obstetric precautions including but not limited to vaginal bleeding, contractions, leaking of fluid and fetal movement were reviewed in detail with the patient. Please refer to After Visit Summary for other counseling recommendations.  Return in about 1 day (around 10/28/2018) for nurse visit BP check and ROB in one week.  Nolene Bernheim, RN, MSN, NP-BC Nurse Practitioner, [facility] for [employer], [facility] 10/27/2018 4:59 PM

## 2018-10-27 NOTE — MAU Note (Signed)
Reports baby moving less for the past wk.  Brought up from clinic for monitoring.  BP was elevated initially. Denies  HA, visual changes or epigastric pain.  Increase swelling in feet.

## 2018-10-27 NOTE — Discharge Instructions (Signed)
Fetal Movement Counts °Patient Name: ________________________________________________ Patient Due Date: ____________________ °What is a fetal movement count? ° °A fetal movement count is the number of times that you feel your baby move during a certain amount of time. This may also be called a fetal kick count. A fetal movement count is recommended for every pregnant woman. You may be asked to start counting fetal movements as early as week 28 of your pregnancy. °Pay attention to when your baby is most active. You may notice your baby's sleep and wake cycles. You may also notice things that make your baby move more. You should do a fetal movement count: °· When your baby is normally most active. °· At the same time each day. °A good time to count movements is while you are resting, after having something to eat and drink. °How do I count fetal movements? °1. Find a quiet, comfortable area. Sit, or lie down on your side. °2. Write down the date, the start time and stop time, and the number of movements that you felt between those two times. Take this information with you to your health care visits. °3. For 2 hours, count kicks, flutters, swishes, rolls, and jabs. You should feel at least 10 movements during 2 hours. °4. You may stop counting after you have felt 10 movements. °5. If you do not feel 10 movements in 2 hours, have something to eat and drink. Then, keep resting and counting for 1 hour. If you feel at least 4 movements during that hour, you may stop counting. °Contact a health care provider if: °· You feel fewer than 4 movements in 2 hours. °· Your baby is not moving like he or she usually does. °Date: ____________ Start time: ____________ Stop time: ____________ Movements: ____________ °Date: ____________ Start time: ____________ Stop time: ____________ Movements: ____________ °Date: ____________ Start time: ____________ Stop time: ____________ Movements: ____________ °Date: ____________ Start time:  ____________ Stop time: ____________ Movements: ____________ °Date: ____________ Start time: ____________ Stop time: ____________ Movements: ____________ °Date: ____________ Start time: ____________ Stop time: ____________ Movements: ____________ °Date: ____________ Start time: ____________ Stop time: ____________ Movements: ____________ °Date: ____________ Start time: ____________ Stop time: ____________ Movements: ____________ °Date: ____________ Start time: ____________ Stop time: ____________ Movements: ____________ °This information is not intended to replace advice given to you by your health care provider. Make sure you discuss any questions you have with your health care provider. °Document Released: 10/29/2006 Document Revised: 05/28/2016 Document Reviewed: 11/08/2015 °Elsevier Interactive Patient Education © 2019 Elsevier Inc. ° ° °Hypertension During Pregnancy ° °Hypertension is also called high blood pressure. High blood pressure means that the force of your blood moving in your body is too strong. When you are pregnant, this condition should be watched carefully. It can cause problems for you and your baby. °Follow these instructions at home: °Eating and drinking ° °· Drink enough fluid to keep your pee (urine) pale yellow. °· Avoid caffeine. °Lifestyle °· Do not use any products that contain nicotine or tobacco, such as cigarettes and e-cigarettes. If you need help quitting, ask your doctor. °· Do not use alcohol or drugs. °· Avoid stress. °· Rest and get plenty of sleep. °General instructions °· Take over-the-counter and prescription medicines only as told by your doctor. °· While lying down, lie on your left side. This keeps pressure off your major blood vessels. °· While sitting or lying down, raise (elevate) your feet. Try putting some pillows under your lower legs. °· Exercise regularly. Ask your doctor   what kinds of exercise are best for you. °· Keep all prenatal and follow-up visits as told by  your doctor. This is important. °Contact a doctor if: °· You have symptoms that your doctor told you to watch for, such as: °? Throwing up (vomiting). °? Feeling sick to your stomach (nausea). °? Headache. °Get help right away if you have: °· Very bad belly pain that does not get better with treatment. °· A very bad headache that does not get better. °· Throwing up that does not get better with treatment. °· Sudden, fast weight gain. °· Sudden swelling in your hands, ankles, or face. °· Bleeding from your vagina. °· Blood in your pee. °· Fewer movements from your baby than usual. °· Blurry vision. °· Double vision. °· Muscle twitching. °· Sudden muscle tightening (spasms). °· Trouble breathing. °· Blue fingernails or lips. °Summary °· Hypertension is also called high blood pressure. High blood pressure means that the force of your blood moving in your body is too strong. °· When you are pregnant, this condition should be watched carefully. It can cause problems for you and your baby. °· Get help right away if you have symptoms that your doctor told you to watch for. °This information is not intended to replace advice given to you by your health care provider. Make sure you discuss any questions you have with your health care provider. °Document Released: 11/01/2010 Document Revised: 09/15/2017 Document Reviewed: 06/10/2016 °Elsevier Interactive Patient Education © 2019 Elsevier Inc. ° °

## 2018-10-28 ENCOUNTER — Ambulatory Visit (INDEPENDENT_AMBULATORY_CARE_PROVIDER_SITE_OTHER): Payer: BLUE CROSS/BLUE SHIELD | Admitting: General Practice

## 2018-10-28 VITALS — BP 127/83 | HR 65 | Ht 61.0 in | Wt 125.0 lb

## 2018-10-28 DIAGNOSIS — Z0130 Encounter for examination of blood pressure without abnormal findings: Secondary | ICD-10-CM

## 2018-10-28 NOTE — Progress Notes (Signed)
Patient presents to office today for BP check following elevated blood pressure yesterday x1 in office. Patient denies headaches, dizziness, or blurry vision. No edema noted. Patient reports + fetal movement today. Blood pressure is WNL today- patient will follow up next week at scheduled prenatal visit.  Chase Caller RN BSN 10/28/18

## 2018-11-01 NOTE — Progress Notes (Signed)
I have reviewed this chart and agree with the RN/CMA assessment and management.    Tayven Renteria C Redonna Wilbert, MD, FACOG Attending Physician, [facility]  

## 2018-11-03 ENCOUNTER — Ambulatory Visit (INDEPENDENT_AMBULATORY_CARE_PROVIDER_SITE_OTHER): Payer: BLUE CROSS/BLUE SHIELD | Admitting: Nurse Practitioner

## 2018-11-03 VITALS — BP 121/80 | HR 66 | Wt 124.0 lb

## 2018-11-03 DIAGNOSIS — Z3483 Encounter for supervision of other normal pregnancy, third trimester: Secondary | ICD-10-CM

## 2018-11-03 DIAGNOSIS — Z98891 History of uterine scar from previous surgery: Secondary | ICD-10-CM

## 2018-11-03 NOTE — Progress Notes (Signed)
    Subjective:  Sally Hampton is a 23 y.o. [phone] at [redacted]w[redacted]d being seen today for ongoing prenatal care.  She is currently monitored for the following issues for this low-risk pregnancy and has Language barrier; Eye lesion; History of C-section; and Supervision of normal pregnancy on their problem list.   Last visits was sent to MAU for elevated BP and baby was not moving well.  Labs were fine and NST was reactive.  Patient reports no complaints and baby is moving well..  Contractions: Not present. Vag. Bleeding: None.  Movement: Present. Denies leaking of fluid.   The following portions of the patient's history were reviewed and updated as appropriate: allergies, current medications, past family history, past medical history, past social history, past surgical history and problem list. Problem list updated.  Objective:   Vitals:   11/03/18 1619  BP: 121/80  Pulse: 66  Weight: 124 lb (56.2 kg)    Fetal Status: Fetal Heart Rate (bpm): 140 Fundal Height: 40 cm Movement: Present     General:  Alert, oriented and cooperative. Patient is in no acute distress.  Skin: Skin is warm and dry. No rash noted.   Cardiovascular: Normal heart rate noted  Respiratory: Normal respiratory effort, no problems with respiration noted  Abdomen: Soft, gravid, appropriate for gestational age. Pain/Pressure: Present     Pelvic:  Cervical exam deferred        Extremities: Normal range of motion.  Edema: None  Mental Status: Normal mood and affect. Normal behavior. Normal judgment and thought content.   Urinalysis:      Assessment and Plan:  Pregnancy: [id number] at [redacted]w[redacted]d  1. Encounter for supervision of other normal pregnancy in third trimester Looks good.  No ankle edema but reports feet are larger than usual for her - wearing sandals.  2. History of C-section Plans TOLAC - consent signed - having only a few contractions all day  Term labor symptoms and general obstetric precautions including but  not limited to vaginal bleeding, contractions, leaking of fluid and fetal movement were reviewed in detail with the patient. Please refer to After Visit Summary for other counseling recommendations.  Return in about 1 week (around 11/10/2018).  Nolene Bernheim, RN, MSN, NP-BC Nurse Practitioner, [facility] for [employer], [facility] 11/03/2018 4:34 PM

## 2018-11-11 ENCOUNTER — Ambulatory Visit (INDEPENDENT_AMBULATORY_CARE_PROVIDER_SITE_OTHER): Payer: BLUE CROSS/BLUE SHIELD | Admitting: Obstetrics and Gynecology

## 2018-11-11 VITALS — BP 130/82 | HR 63 | Wt 129.0 lb

## 2018-11-11 DIAGNOSIS — Z98891 History of uterine scar from previous surgery: Secondary | ICD-10-CM

## 2018-11-11 DIAGNOSIS — Z3483 Encounter for supervision of other normal pregnancy, third trimester: Secondary | ICD-10-CM

## 2018-11-11 NOTE — Patient Instructions (Signed)

## 2018-11-11 NOTE — Progress Notes (Signed)
   PRENATAL VISIT NOTE  Subjective:  Sally Hampton is a 23 y.o. [id number] at [redacted]w[redacted]d being seen today for ongoing prenatal care.  She is currently monitored for the following issues for this low-risk pregnancy and has Language barrier; Eye lesion; History of C-section; and Supervision of normal pregnancy on their problem list.  Patient reports no complaints.  Contractions: Not present. Vag. Bleeding: None.  Movement: Present. Denies leaking of fluid.   The following portions of the patient's history were reviewed and updated as appropriate: allergies, current medications, past family history, past medical history, past social history, past surgical history and problem list. Problem list updated.  Objective:   Vitals:   11/11/18 0853  BP: 130/82  Pulse: 63  Weight: 129 lb (58.5 kg)    Fetal Status: Fetal Heart Rate (bpm): 139 Fundal Height: 40 cm Movement: Present     General:  Alert, oriented and cooperative. Patient is in no acute distress.  Skin: Skin is warm and dry. No rash noted.   Cardiovascular: Normal heart rate noted  Respiratory: Normal respiratory effort, no problems with respiration noted  Abdomen: Soft, gravid, appropriate for gestational age.  Pain/Pressure: Present     Pelvic: Cervical exam performed Dilation: Fingertip Effacement (%): Thick Station: -3  Extremities: Normal range of motion.  Edema: None  Mental Status: Normal mood and affect. Normal behavior. Normal judgment and thought content.   Assessment and Plan:  Pregnancy: [id number] at [redacted]w[redacted]d  1. History of C-section x2 Consent signed for VBAC with Ivonne Andrew CNM   2. Encounter for supervision of other normal pregnancy in third trimester  Patient to return AM 2/7 for foley insertion and NST Induction scheduled for 2/8 Midnight. Patient made aware.  GBS negative.   There are no diagnoses linked to this encounter. Term labor symptoms and general obstetric precautions including but not limited to vaginal  bleeding, contractions, leaking of fluid and fetal movement were reviewed in detail with the patient. Please refer to After Visit Summary for other counseling recommendations.  Return for Come back Am 2/7 for NST and foley insertion.  Future Appointments  Date Time Provider Department Center  11/19/2018  9:15 AM Allie Bossier, MD [facility]  11/20/2018 12:00 AM [facility] ROOM [facility] None    Venia Carbon, NP

## 2018-11-16 ENCOUNTER — Telehealth (HOSPITAL_COMMUNITY): Payer: Self-pay | Admitting: *Deleted

## 2018-11-16 ENCOUNTER — Encounter (HOSPITAL_COMMUNITY): Payer: Self-pay | Admitting: *Deleted

## 2018-11-16 NOTE — Telephone Encounter (Signed)
Preadmission screen Interpreter number [id number] 

## 2018-11-17 ENCOUNTER — Encounter: Payer: BLUE CROSS/BLUE SHIELD | Admitting: Nurse Practitioner

## 2018-11-19 ENCOUNTER — Ambulatory Visit (INDEPENDENT_AMBULATORY_CARE_PROVIDER_SITE_OTHER): Payer: BLUE CROSS/BLUE SHIELD | Admitting: Obstetrics & Gynecology

## 2018-11-19 VITALS — BP 138/85 | HR 65 | Wt 131.7 lb

## 2018-11-19 DIAGNOSIS — Z3483 Encounter for supervision of other normal pregnancy, third trimester: Secondary | ICD-10-CM | POA: Diagnosis not present

## 2018-11-19 DIAGNOSIS — Z98891 History of uterine scar from previous surgery: Secondary | ICD-10-CM

## 2018-11-19 DIAGNOSIS — O480 Post-term pregnancy: Secondary | ICD-10-CM

## 2018-11-19 NOTE — Progress Notes (Signed)
   PRENATAL VISIT NOTE  Subjective:  Sally Hampton is a 23 y.o. [phone] at [redacted]w[redacted]d being seen today for ongoing prenatal care.  She is currently monitored for the following issues for this high-risk pregnancy and has Language barrier; Eye lesion; History of C-section; and Supervision of normal pregnancy on their problem list.  Patient reports no complaints.  Contractions: Not present. Vag. Bleeding: None.  Movement: Present. Denies leaking of fluid.   The following portions of the patient's history were reviewed and updated as appropriate: allergies, current medications, past family history, past medical history, past social history, past surgical history and problem list. Problem list updated.  Objective:   Vitals:   11/19/18 0932  BP: 138/85  Pulse: 65  Weight: 131 lb 11.2 oz (59.7 kg)    Fetal Status: Fetal Heart Rate (bpm): NST   Movement: Present     General:  Alert, oriented and cooperative. Patient is in no acute distress.  Skin: Skin is warm and dry. No rash noted.   Cardiovascular: Normal heart rate noted  Respiratory: Normal respiratory effort, no problems with respiration noted  Abdomen: Soft, gravid, appropriate for gestational age.  Pain/Pressure: Present     Pelvic: After consenting her for the procedure, I prepped her cervix with betadine and placed a Foley into the cervix. I filled the balloon with 55 cc of saline. She tolerated the procedure well.  Extremities: Normal range of motion.  Edema: None  Mental Status: Normal mood and affect. Normal behavior. Normal judgment and thought content.   Assessment and Plan:  Pregnancy: [id number] at [redacted]w[redacted]d  1. Encounter for supervision of other normal pregnancy in third trimester   2. Post term pregnancy, antepartum condition or complication  - Fetal nonstress test  3. History of C-section -She declines repeat, wants IOL   Preterm labor symptoms and general obstetric precautions including but not limited to vaginal  bleeding, contractions, leaking of fluid and fetal movement were reviewed in detail with the patient. Please refer to After Visit Summary for other counseling recommendations.  No follow-ups on file.  Future Appointments  Date Time Provider Department Center  11/20/2018 12:00 AM [facility] RO[facility] None    Allie Bossier, MD

## 2018-11-20 ENCOUNTER — Inpatient Hospital Stay (HOSPITAL_COMMUNITY): Payer: BLUE CROSS/BLUE SHIELD | Admitting: Anesthesiology

## 2018-11-20 ENCOUNTER — Other Ambulatory Visit: Payer: Self-pay

## 2018-11-20 ENCOUNTER — Inpatient Hospital Stay (HOSPITAL_COMMUNITY)
Admission: RE | Admit: 2018-11-20 | Discharge: 2018-11-22 | DRG: 788 | Disposition: A | Discharge status: Home / Self Care | Payer: BLUE CROSS/BLUE SHIELD | Attending: Obstetrics and Gynecology | Admitting: Obstetrics and Gynecology

## 2018-11-20 ENCOUNTER — Encounter (HOSPITAL_COMMUNITY)
Admission: RE | Disposition: A | Discharge status: Home / Self Care | Payer: Self-pay | Source: Home / Self Care | Attending: Obstetrics and Gynecology

## 2018-11-20 ENCOUNTER — Encounter (HOSPITAL_COMMUNITY): Payer: Self-pay

## 2018-11-20 VITALS — BP 112/65 | HR 58 | Temp 98.3°F | Resp 18 | Ht 61.0 in | Wt 131.0 lb

## 2018-11-20 DIAGNOSIS — O480 Post-term pregnancy: Principal | ICD-10-CM | POA: Diagnosis present

## 2018-11-20 DIAGNOSIS — Z3A40 40 weeks gestation of pregnancy: Secondary | ICD-10-CM | POA: Diagnosis not present

## 2018-11-20 DIAGNOSIS — O34211 Maternal care for low transverse scar from previous cesarean delivery: Secondary | ICD-10-CM | POA: Diagnosis present

## 2018-11-20 DIAGNOSIS — O1414 Severe pre-eclampsia complicating childbirth: Secondary | ICD-10-CM | POA: Diagnosis present

## 2018-11-20 DIAGNOSIS — O34219 Maternal care for unspecified type scar from previous cesarean delivery: Secondary | ICD-10-CM | POA: Diagnosis not present

## 2018-11-20 DIAGNOSIS — L729 Follicular cyst of the skin and subcutaneous tissue, unspecified: Secondary | ICD-10-CM | POA: Diagnosis not present

## 2018-11-20 DIAGNOSIS — Z98891 History of uterine scar from previous surgery: Secondary | ICD-10-CM

## 2018-11-20 DIAGNOSIS — Z789 Other specified health status: Secondary | ICD-10-CM | POA: Diagnosis present

## 2018-11-20 DIAGNOSIS — Z758 Other problems related to medical facilities and other health care: Secondary | ICD-10-CM | POA: Diagnosis present

## 2018-11-20 HISTORY — DX: History of uterine scar from previous surgery: Z98.891

## 2018-11-20 LAB — CREATININE, SERUM
Creatinine, Ser: 0.87 mg/dL (ref 0.44–1.00)
GFR calc Af Amer: >60 mL/min (ref >60)
GFR calc non Af Amer: >60 mL/min (ref >60)

## 2018-11-20 LAB — CBC
HCT: 37 % (ref 36.0–46.0)
HCT: 39.3 % (ref 36.0–46.0)
HEMATOCRIT: 38.5 % (ref 36.0–46.0)
HEMOGLOBIN: 13 g/dL (ref 12.0–15.0)
Hemoglobin: 12.4 g/dL (ref 12.0–15.0)
Hemoglobin: 12.8 g/dL (ref 12.0–15.0)
MCH: 32.1 pg (ref 26.0–34.0)
MCH: 32.2 pg (ref 26.0–34.0)
MCH: 32.2 pg (ref 26.0–34.0)
MCHC: 33.5 g/dL (ref 30.0–36.0)
MCHC: 33.2 g/dL (ref 30.0–36.0)
MCHC: 33.1 g/dL (ref 30.0–36.0)
MCV: 95.9 fL (ref 80.0–100.0)
MCV: 97 fL (ref 80.0–100.0)
MCV: 97.3 fL (ref 80.0–100.0)
Platelets: 174 10*3/uL (ref 150–400)
Platelets: 177 10*3/uL (ref 150–400)
Platelets: 171 10*3/uL (ref 150–400)
RBC: 3.86 MIL/uL — ABNORMAL LOW (ref 3.87–5.11)
RBC: 3.97 MIL/uL (ref 3.87–5.11)
RBC: 4.04 MIL/uL (ref 3.87–5.11)
RDW: 12.7 % (ref 11.5–15.5)
RDW: 13.2 % (ref 11.5–15.5)
RDW: 13.2 % (ref 11.5–15.5)
WBC: 7 10*3/uL (ref 4.0–10.5)
WBC: 9.3 10*3/uL (ref 4.0–10.5)
WBC: 11.8 10*3/uL — ABNORMAL HIGH (ref 4.0–10.5)
nRBC: 0 % (ref 0.0–0.2)
nRBC: 0 % (ref 0.0–0.2)

## 2018-11-20 LAB — COMPREHENSIVE METABOLIC PANEL
ALBUMIN: 3.3 g/dL — AB (ref 3.5–5.0)
ALT: 9 U/L (ref 0–44)
ALT: 11 U/L (ref 0–44)
AST: 23 U/L (ref 15–41)
AST: 24 U/L (ref 15–41)
Albumin: 3.1 g/dL — ABNORMAL LOW (ref 3.5–5.0)
Alkaline Phosphatase: 215 U/L — ABNORMAL HIGH (ref 38–126)
Alkaline Phosphatase: 199 U/L — ABNORMAL HIGH (ref 38–126)
Anion gap: 5 (ref 5–15)
Anion gap: 6 (ref 5–15)
BILIRUBIN TOTAL: 0.5 mg/dL (ref 0.3–1.2)
BILIRUBIN TOTAL: 0.3 mg/dL (ref 0.3–1.2)
BUN: 12 mg/dL (ref 6–20)
BUN: 10 mg/dL (ref 6–20)
CO2: 19 mmol/L — ABNORMAL LOW (ref 22–32)
CO2: 21 mmol/L — ABNORMAL LOW (ref 22–32)
CREATININE: 0.85 mg/dL (ref 0.44–1.00)
Calcium: 8.5 mg/dL — ABNORMAL LOW (ref 8.9–10.3)
Calcium: 8.5 mg/dL — ABNORMAL LOW (ref 8.9–10.3)
Chloride: 108 mmol/L (ref 98–111)
Chloride: 105 mmol/L (ref 98–111)
Creatinine, Ser: 0.79 mg/dL (ref 0.44–1.00)
GFR calc Af Amer: >60 mL/min (ref >60)
GFR calc Af Amer: >60 mL/min (ref >60)
GFR calc non Af Amer: >60 mL/min (ref >60)
GFR calc non Af Amer: >60 mL/min (ref >60)
Glucose, Bld: 82 mg/dL (ref 70–99)
Glucose, Bld: 94 mg/dL (ref 70–99)
Potassium: 4.1 mmol/L (ref 3.5–5.1)
Potassium: 4 mmol/L (ref 3.5–5.1)
Sodium: 132 mmol/L — ABNORMAL LOW (ref 135–145)
Sodium: 132 mmol/L — ABNORMAL LOW (ref 135–145)
TOTAL PROTEIN: 6.5 g/dL (ref 6.5–8.1)
Total Protein: 6.7 g/dL (ref 6.5–8.1)

## 2018-11-20 LAB — PROTEIN / CREATININE RATIO, URINE
Creatinine, Urine: 54 mg/dL
Protein Creatinine Ratio: 0.39 mg/mg{Cre} — ABNORMAL HIGH (ref 0.00–0.15)
Total Protein, Urine: 21 mg/dL

## 2018-11-20 LAB — TYPE AND SCREEN
ABO/RH(D): O POS
Antibody Screen: NEGATIVE

## 2018-11-20 LAB — ABO/RH: ABO/RH(D): O POS

## 2018-11-20 LAB — RPR: RPR Ser Ql: NONREACTIVE

## 2018-11-20 SURGERY — Surgical Case
Anesthesia: Epidural

## 2018-11-20 MED ORDER — TETANUS-DIPHTH-ACELL PERTUSSIS 5-2.5-18.5 LF-MCG/0.5 IM SUSP: 0.5000 mL | Freq: Once | INTRAMUSCULAR | Status: DC

## 2018-11-20 MED ORDER — SENNOSIDES-DOCUSATE SODIUM 8.6-50 MG PO TABS
2.0000 | ORAL_TABLET | ORAL | Status: DC
  Administered 2018-11-21: 2 via ORAL
  Filled 2018-11-20: qty 2

## 2018-11-20 MED ORDER — DIPHENHYDRAMINE HCL 50 MG/ML IJ SOLN: 12.5000 mg | INTRAMUSCULAR | Status: DC | PRN

## 2018-11-20 MED ORDER — ENOXAPARIN SODIUM 40 MG/0.4ML ~~LOC~~ SOLN
40.0000 mg | SUBCUTANEOUS | Status: DC
  Administered 2018-11-21 – 2018-11-22 (×2): 40 mg via SUBCUTANEOUS
  Filled 2018-11-20 (×2): qty 0.4

## 2018-11-20 MED ORDER — SOD CITRATE-CITRIC ACID 500-334 MG/5ML PO SOLN
30.0000 mL | ORAL | Status: DC | PRN
  Administered 2018-11-20: 30 mL via ORAL
  Filled 2018-11-20: qty 15

## 2018-11-20 MED ORDER — ACETAMINOPHEN 325 MG PO TABS
650.0000 mg | ORAL_TABLET | ORAL | Status: DC | PRN
  Administered 2018-11-21 – 2018-11-22 (×2): 650 mg via ORAL
  Filled 2018-11-20 (×2): qty 2

## 2018-11-20 MED ORDER — LIDOCAINE HCL (PF) 1 % IJ SOLN: 30.0000 mL | INTRAMUSCULAR | Status: DC | PRN

## 2018-11-20 MED ORDER — KETOROLAC TROMETHAMINE 30 MG/ML IJ SOLN
30.0000 mg | Freq: Four times a day (QID) | INTRAMUSCULAR | Status: AC | PRN
  Administered 2018-11-20: 30 mg via INTRAVENOUS
  Filled 2018-11-20: qty 1

## 2018-11-20 MED ORDER — SIMETHICONE 80 MG PO CHEW: 80.0000 mg | CHEWABLE_TABLET | ORAL | Status: DC | PRN

## 2018-11-20 MED ORDER — MEPERIDINE HCL 25 MG/ML IJ SOLN: 6.2500 mg | INTRAMUSCULAR | Status: DC | PRN

## 2018-11-20 MED ORDER — SIMETHICONE 80 MG PO CHEW: 80.0000 mg | CHEWABLE_TABLET | Freq: Three times a day (TID) | ORAL | Status: DC

## 2018-11-20 MED ORDER — METOCLOPRAMIDE HCL 5 MG/ML IJ SOLN
INTRAMUSCULAR | Status: DC | PRN
  Administered 2018-11-20: 10 mg via INTRAVENOUS

## 2018-11-20 MED ORDER — OXYCODONE-ACETAMINOPHEN 5-325 MG PO TABS: 2.0000 | ORAL_TABLET | ORAL | Status: DC | PRN

## 2018-11-20 MED ORDER — OXYCODONE-ACETAMINOPHEN 5-325 MG PO TABS: 1.0000 | ORAL_TABLET | ORAL | Status: DC | PRN

## 2018-11-20 MED ORDER — FENTANYL CITRATE (PF) 100 MCG/2ML IJ SOLN
100.0000 ug | INTRAMUSCULAR | Status: DC | PRN
  Administered 2018-11-20: 100 ug via INTRAVENOUS
  Filled 2018-11-20: qty 2

## 2018-11-20 MED ORDER — SODIUM BICARBONATE 8.4 % IV SOLN
INTRAVENOUS | Status: DC | PRN
  Administered 2018-11-20 (×2): 5 mL via EPIDURAL

## 2018-11-20 MED ORDER — LACTATED RINGERS IV SOLN: 500.0000 mL | Freq: Once | INTRAVENOUS | Status: DC

## 2018-11-20 MED ORDER — EPHEDRINE 5 MG/ML INJ: 10.0000 mg | INTRAVENOUS | Status: DC | PRN

## 2018-11-20 MED ORDER — LACTATED RINGERS IV SOLN
500.0000 mL | INTRAVENOUS | Status: DC | PRN
  Administered 2018-11-20 (×2): 500 mL via INTRAVENOUS

## 2018-11-20 MED ORDER — FENTANYL 2.5 MCG/ML BUPIVACAINE 1/10 % EPIDURAL INFUSION (WH - ANES)
14.0000 mL/h | INTRAMUSCULAR | Status: DC | PRN
  Administered 2018-11-20: 11 mL/h via EPIDURAL
  Filled 2018-11-20: qty 100

## 2018-11-20 MED ORDER — LABETALOL HCL 5 MG/ML IV SOLN
40.0000 mg | INTRAVENOUS | Status: DC | PRN
  Administered 2018-11-20: 40 mg via INTRAVENOUS
  Filled 2018-11-20: qty 8

## 2018-11-20 MED ORDER — ONDANSETRON HCL 4 MG/2ML IJ SOLN
4.0000 mg | Freq: Four times a day (QID) | INTRAMUSCULAR | Status: DC | PRN
  Administered 2018-11-20: 4 mg via INTRAVENOUS
  Filled 2018-11-20 (×2): qty 2

## 2018-11-20 MED ORDER — FLEET ENEMA 7-19 GM/118ML RE ENEM: 1.0000 | ENEMA | Freq: Every day | RECTAL | Status: DC | PRN

## 2018-11-20 MED ORDER — MEPERIDINE HCL 25 MG/ML IJ SOLN
INTRAMUSCULAR | Status: AC
  Filled 2018-11-20: qty 1

## 2018-11-20 MED ORDER — MORPHINE SULFATE (PF) 0.5 MG/ML IJ SOLN
INTRAMUSCULAR | Status: AC
  Filled 2018-11-20: qty 10

## 2018-11-20 MED ORDER — LABETALOL HCL 5 MG/ML IV SOLN: 80.0000 mg | INTRAVENOUS | Status: DC | PRN

## 2018-11-20 MED ORDER — OXYTOCIN 40 UNITS IN NORMAL SALINE INFUSION - SIMPLE MED: 2.5000 [IU]/h | INTRAVENOUS | Status: DC

## 2018-11-20 MED ORDER — MAGNESIUM SULFATE 40 G IN LACTATED RINGERS - SIMPLE
2.0000 g/h | INTRAVENOUS | Status: AC
  Administered 2018-11-20 – 2018-11-21 (×2): 2 g/h via INTRAVENOUS
  Filled 2018-11-20 (×2): qty 500

## 2018-11-20 MED ORDER — ONDANSETRON HCL 4 MG/2ML IJ SOLN: 4.0000 mg | Freq: Three times a day (TID) | INTRAMUSCULAR | Status: DC | PRN

## 2018-11-20 MED ORDER — SIMETHICONE 80 MG PO CHEW: 80.0000 mg | CHEWABLE_TABLET | ORAL | Status: DC

## 2018-11-20 MED ORDER — SCOPOLAMINE 1 MG/3DAYS TD PT72
MEDICATED_PATCH | TRANSDERMAL | Status: AC
  Filled 2018-11-20: qty 1

## 2018-11-20 MED ORDER — PHENYLEPHRINE 40 MCG/ML (10ML) SYRINGE FOR IV PUSH (FOR BLOOD PRESSURE SUPPORT): 80.0000 ug | PREFILLED_SYRINGE | INTRAVENOUS | Status: DC | PRN

## 2018-11-20 MED ORDER — LACTATED RINGERS IV SOLN
INTRAVENOUS | Status: DC
  Administered 2018-11-20 (×5): via INTRAVENOUS

## 2018-11-20 MED ORDER — FENTANYL CITRATE (PF) 100 MCG/2ML IJ SOLN: 25.0000 ug | INTRAMUSCULAR | Status: DC | PRN

## 2018-11-20 MED ORDER — SODIUM CHLORIDE 0.9 % IV SOLN
INTRAVENOUS | Status: DC
  Administered 2018-11-21: 08:00:00 via INTRAVENOUS

## 2018-11-20 MED ORDER — OXYCODONE HCL 5 MG PO TABS
5.0000 mg | ORAL_TABLET | ORAL | Status: DC | PRN
  Administered 2018-11-22: 5 mg via ORAL
  Filled 2018-11-20: qty 1

## 2018-11-20 MED ORDER — OXYTOCIN BOLUS FROM INFUSION: 500.0000 mL | Freq: Once | INTRAVENOUS | Status: DC

## 2018-11-20 MED ORDER — COCONUT OIL OIL: 1.0000 "application " | TOPICAL_OIL | Status: DC | PRN

## 2018-11-20 MED ORDER — KETOROLAC TROMETHAMINE 30 MG/ML IJ SOLN: 30.0000 mg | Freq: Four times a day (QID) | INTRAMUSCULAR | Status: AC | PRN

## 2018-11-20 MED ORDER — ONDANSETRON HCL 4 MG/2ML IJ SOLN
INTRAMUSCULAR | Status: AC
  Filled 2018-11-20: qty 2

## 2018-11-20 MED ORDER — LACTATED RINGERS IV SOLN
INTRAVENOUS | Status: DC | PRN
  Administered 2018-11-20: 18:00:00 via INTRAVENOUS

## 2018-11-20 MED ORDER — TERBUTALINE SULFATE 1 MG/ML IJ SOLN: 0.2500 mg | Freq: Once | INTRAMUSCULAR | Status: DC | PRN

## 2018-11-20 MED ORDER — SCOPOLAMINE 1 MG/3DAYS TD PT72
MEDICATED_PATCH | TRANSDERMAL | Status: DC | PRN
  Administered 2018-11-20: 1 via TRANSDERMAL

## 2018-11-20 MED ORDER — DEXAMETHASONE SODIUM PHOSPHATE 4 MG/ML IJ SOLN
INTRAMUSCULAR | Status: DC | PRN
  Administered 2018-11-20: 4 mg via INTRAVENOUS

## 2018-11-20 MED ORDER — OXYTOCIN 40 UNITS IN NORMAL SALINE INFUSION - SIMPLE MED
1.0000 m[IU]/min | INTRAVENOUS | Status: DC
  Administered 2018-11-20: 2 m[IU]/min via INTRAVENOUS
  Filled 2018-11-20: qty 1000

## 2018-11-20 MED ORDER — MEPERIDINE HCL 25 MG/ML IJ SOLN
INTRAMUSCULAR | Status: DC | PRN
  Administered 2018-11-20: 12.5 mg via INTRAVENOUS

## 2018-11-20 MED ORDER — OXYTOCIN 10 UNIT/ML IJ SOLN
INTRAMUSCULAR | Status: AC
  Filled 2018-11-20: qty 4

## 2018-11-20 MED ORDER — MORPHINE SULFATE (PF) 0.5 MG/ML IJ SOLN
INTRAMUSCULAR | Status: DC | PRN
  Administered 2018-11-20: 3000 ug via EPIDURAL

## 2018-11-20 MED ORDER — LABETALOL HCL 5 MG/ML IV SOLN
20.0000 mg | INTRAVENOUS | Status: DC | PRN
  Administered 2018-11-20: 20 mg via INTRAVENOUS
  Filled 2018-11-20: qty 4

## 2018-11-20 MED ORDER — DIPHENHYDRAMINE HCL 25 MG PO CAPS: 25.0000 mg | ORAL_CAPSULE | Freq: Four times a day (QID) | ORAL | Status: DC | PRN

## 2018-11-20 MED ORDER — DIBUCAINE 1 % RE OINT: 1.0000 "application " | TOPICAL_OINTMENT | RECTAL | Status: DC | PRN

## 2018-11-20 MED ORDER — MAGNESIUM SULFATE BOLUS VIA INFUSION
4.0000 g | Freq: Once | INTRAVENOUS | Status: AC
  Administered 2018-11-20: 4 g via INTRAVENOUS
  Filled 2018-11-20: qty 500

## 2018-11-20 MED ORDER — PHENYLEPHRINE 40 MCG/ML (10ML) SYRINGE FOR IV PUSH (FOR BLOOD PRESSURE SUPPORT)
80.0000 ug | PREFILLED_SYRINGE | INTRAVENOUS | Status: DC | PRN
  Filled 2018-11-20: qty 10

## 2018-11-20 MED ORDER — HYDRALAZINE HCL 20 MG/ML IJ SOLN: 10.0000 mg | INTRAMUSCULAR | Status: DC | PRN

## 2018-11-20 MED ORDER — WITCH HAZEL-GLYCERIN EX PADS: 1.0000 "application " | MEDICATED_PAD | CUTANEOUS | Status: DC | PRN

## 2018-11-20 MED ORDER — HYDROMORPHONE HCL 1 MG/ML IJ SOLN: 0.5000 mg | INTRAMUSCULAR | Status: DC | PRN

## 2018-11-20 MED ORDER — OXYTOCIN 40 UNITS IN NORMAL SALINE INFUSION - SIMPLE MED
INTRAVENOUS | Status: DC | PRN
  Administered 2018-11-20: 40 [IU] via INTRAVENOUS

## 2018-11-20 MED ORDER — NALOXONE HCL 0.4 MG/ML IJ SOLN: 0.4000 mg | INTRAMUSCULAR | Status: DC | PRN

## 2018-11-20 MED ORDER — SODIUM CHLORIDE 0.9% FLUSH: 3.0000 mL | INTRAVENOUS | Status: DC | PRN

## 2018-11-20 MED ORDER — BUPIVACAINE HCL (PF) 0.5 % IJ SOLN
INTRAMUSCULAR | Status: DC | PRN
  Administered 2018-11-20: 20 mL

## 2018-11-20 MED ORDER — CEFAZOLIN SODIUM-DEXTROSE 2-4 GM/100ML-% IV SOLN
INTRAVENOUS | Status: AC
  Filled 2018-11-20: qty 100

## 2018-11-20 MED ORDER — MENTHOL 3 MG MT LOZG: 1.0000 | LOZENGE | OROMUCOSAL | Status: DC | PRN

## 2018-11-20 MED ORDER — METOCLOPRAMIDE HCL 5 MG/ML IJ SOLN
INTRAMUSCULAR | Status: AC
  Filled 2018-11-20: qty 2

## 2018-11-20 MED ORDER — CEFAZOLIN SODIUM-DEXTROSE 2-4 GM/100ML-% IV SOLN
2.0000 g | Freq: Once | INTRAVENOUS | Status: AC
  Administered 2018-11-20: 2 g via INTRAVENOUS
  Filled 2018-11-20: qty 100

## 2018-11-20 MED ORDER — PRENATAL MULTIVITAMIN CH
1.0000 | ORAL_TABLET | Freq: Every day | ORAL | Status: DC
  Administered 2018-11-21 – 2018-11-22 (×2): 1 via ORAL
  Filled 2018-11-20 (×2): qty 1

## 2018-11-20 MED ORDER — PHENYLEPHRINE 40 MCG/ML (10ML) SYRINGE FOR IV PUSH (FOR BLOOD PRESSURE SUPPORT)
PREFILLED_SYRINGE | INTRAVENOUS | Status: AC
  Filled 2018-11-20: qty 10

## 2018-11-20 MED ORDER — DEXAMETHASONE SODIUM PHOSPHATE 4 MG/ML IJ SOLN
INTRAMUSCULAR | Status: AC
  Filled 2018-11-20: qty 1

## 2018-11-20 MED ORDER — ACETAMINOPHEN 325 MG PO TABS
650.0000 mg | ORAL_TABLET | ORAL | Status: DC | PRN
  Administered 2018-11-20: 650 mg via ORAL
  Filled 2018-11-20: qty 2

## 2018-11-20 MED ORDER — BUPIVACAINE HCL (PF) 0.5 % IJ SOLN
INTRAMUSCULAR | Status: AC
  Filled 2018-11-20: qty 30

## 2018-11-20 MED ORDER — ZOLPIDEM TARTRATE 5 MG PO TABS: 5.0000 mg | ORAL_TABLET | Freq: Every evening | ORAL | Status: DC | PRN

## 2018-11-20 MED ORDER — LIDOCAINE HCL (PF) 1 % IJ SOLN
INTRAMUSCULAR | Status: DC | PRN
  Administered 2018-11-20 (×2): 4 mL via EPIDURAL

## 2018-11-20 MED ORDER — LIDOCAINE-EPINEPHRINE (PF) 2 %-1:200000 IJ SOLN
INTRAMUSCULAR | Status: AC
  Filled 2018-11-20: qty 20

## 2018-11-20 MED ORDER — ONDANSETRON HCL 4 MG/2ML IJ SOLN
INTRAMUSCULAR | Status: DC | PRN
  Administered 2018-11-20: 4 mg via INTRAVENOUS

## 2018-11-20 MED ORDER — OXYTOCIN 40 UNITS IN NORMAL SALINE INFUSION - SIMPLE MED: 2.5000 [IU]/h | INTRAVENOUS | Status: AC

## 2018-11-20 SURGICAL SUPPLY — 35 items
BENZOIN TINCTURE PRP APPL 2/3 (GAUZE/BANDAGES/DRESSINGS) ×3 IMPLANT
CHLORAPREP W/TINT 26ML (MISCELLANEOUS) ×3 IMPLANT
CLAMP CORD UMBIL (MISCELLANEOUS) IMPLANT
CLOSURE WOUND 1/2 X4 (GAUZE/BANDAGES/DRESSINGS) ×1
CLOTH BEACON ORANGE TIMEOUT ST (SAFETY) ×3 IMPLANT
DRSG OPSITE POSTOP 4X10 (GAUZE/BANDAGES/DRESSINGS) ×3 IMPLANT
ELECT REM PT RETURN 9FT ADLT (ELECTROSURGICAL) ×3
ELECTRODE REM PT RTRN 9FT ADLT (ELECTROSURGICAL) ×1 IMPLANT
EXTRACTOR VACUUM BELL STYLE (SUCTIONS) IMPLANT
GLOVE BIOGEL PI IND STRL 6.5 (GLOVE) ×1 IMPLANT
GLOVE BIOGEL PI IND STRL 7.0 (GLOVE) ×2 IMPLANT
GLOVE BIOGEL PI INDICATOR 6.5 (GLOVE) ×2
GLOVE BIOGEL PI INDICATOR 7.0 (GLOVE) ×4
GLOVE ORTHOPEDIC STR SZ6.5 (GLOVE) ×3 IMPLANT
GOWN STRL REUS W/TWL LRG LVL3 (GOWN DISPOSABLE) ×9 IMPLANT
HEMOSTAT ARISTA ABSORB 3G PWDR (HEMOSTASIS) ×3 IMPLANT
KIT ABG SYR 3ML LUER SLIP (SYRINGE) IMPLANT
NEEDLE HYPO 22GX1.5 SAFETY (NEEDLE) ×3 IMPLANT
NEEDLE HYPO 25X1 1.5 SAFETY (NEEDLE) IMPLANT
NS IRRIG 1000ML POUR BTL (IV SOLUTION) ×3 IMPLANT
PACK C SECTION WH (CUSTOM PROCEDURE TRAY) ×3 IMPLANT
PAD ABD 7.5X8 STRL (GAUZE/BANDAGES/DRESSINGS) ×6 IMPLANT
PAD OB MATERNITY 4.3X12.25 (PERSONAL CARE ITEMS) ×3 IMPLANT
PENCIL SMOKE EVAC W/HOLSTER (ELECTROSURGICAL) ×3 IMPLANT
STRIP CLOSURE SKIN 1/2X4 (GAUZE/BANDAGES/DRESSINGS) ×2 IMPLANT
SUT MON AB 4-0 PS1 27 (SUTURE) ×3 IMPLANT
SUT PLAIN 2 0 (SUTURE) ×2
SUT PLAIN ABS 2-0 CT1 27XMFL (SUTURE) ×1 IMPLANT
SUT VIC AB 0 CT1 36 (SUTURE) ×6 IMPLANT
SUT VIC AB 0 CTX 36 (SUTURE) ×2
SUT VIC AB 0 CTX36XBRD ANBCTRL (SUTURE) ×1 IMPLANT
SYR CONTROL 10ML LL (SYRINGE) ×3 IMPLANT
TOWEL OR 17X24 6PK STRL BLUE (TOWEL DISPOSABLE) ×3 IMPLANT
TRAY FOLEY W/BAG SLVR 14FR LF (SET/KITS/TRAYS/PACK) ×3 IMPLANT
WATER STERILE IRR 1000ML POUR (IV SOLUTION) ×3 IMPLANT

## 2018-11-20 NOTE — Progress Notes (Signed)
[facility]N [facility]: Labor Progress Note  Subjective: Patient uncomfortable, now agreeable to epidural   Objective: BP 107/75   Pulse (!) 58   Temp 98.6 F (37 C) (Oral)   Resp (!) 98   Ht 5\' 1"  (1.549 m)   Wt 59.4 kg   LMP 02/28/2018   SpO2 99%   BMI 24.75 kg/m  Gen: uncomfortable appearing during contractions  Dilation: 8 Effacement (%): 80 Station: -2 Presentation: Vertex Exam by:: Jeanett Schlein, RN  Assessment and Plan: 23 y.o. [id number] [redacted]w[redacted]d here for PDIOL and developed intrapartum severe preeclampsia.  Labor: Active labor. Continue to titrate pitocin per protocol but progressing well.  -- pain control: epidural  -- PPH Risk: medium  Severe Preeclampsia: By blood pressures. Asymptomatic. HELLP labs wnl. UPC not yet collected. -- Mg++ -- labetalol protocol for BP management   Fetal Well-Being:  Cephalic by BSUS.  -- Category I - continuous fetal monitoring  -- GBS negative   Aftan Vint S. Earlene Plater, DO OB/GYN Fellow, Faculty Practice  6:39 AM

## 2018-11-20 NOTE — Anesthesia Postprocedure Evaluation (Signed)
Anesthesia Post Note  Patient: Sally Hampton  Procedure(s) Performed: CESAREAN SECTION (N/A )     Patient location during evaluation: PACU Anesthesia Type: Epidural Level of consciousness: oriented and awake and alert Pain management: pain level controlled Vital Signs Assessment: post-procedure vital signs reviewed and stable Respiratory status: spontaneous breathing, respiratory function stable and nonlabored ventilation Cardiovascular status: blood pressure returned to baseline and stable Postop Assessment: no headache, no backache, no apparent nausea or vomiting and epidural receding Anesthetic complications: no    Last Vitals:  Vitals:   11/20/18 1855 11/20/18 1900  BP:  (!) 144/89  Pulse: 61 (!) 52  Resp: 18 (!) 21  Temp:    SpO2: 100% 99%    Last Pain:  Vitals:   11/20/18 1900  TempSrc:   PainSc: 0-No pain   Pain Goal: Patients Stated Pain Goal: 0 (11/20/18 0725)              Epidural/Spinal Function Cutaneous sensation: Tingles (11/20/18 1915), Patient able to flex knees: No (11/20/18 1915), Patient able to lift hips off bed: No (11/20/18 1915), Back pain beyond tenderness at insertion site: No (11/20/18 1915), Progressively worsening motor and/or sensory loss: No (11/20/18 1915), Bowel and/or bladder incontinence post epidural: No (11/20/18 1915)  Sally Hampton A.

## 2018-11-20 NOTE — Progress Notes (Signed)
LABOR PROGRESS NOTE  Sally Hampton is a 23 y.o. [id number] at [redacted]w[redacted]d  admitted for IOL after outpatient FB placement for post dates.   Subjective: Doing well. Comfortable with epidural.   Objective: BP 120/76   Pulse (!) 59   Temp 97.8 F (36.6 C) (Oral)   Resp 18   Ht 5\' 1"  (1.549 m)   Wt 59.4 kg   LMP 02/28/2018   SpO2 99%   BMI 24.75 kg/m  or  Vitals:   11/20/18 0930 11/20/18 0940 11/20/18 0950 11/20/18 1000  BP: 111/69 105/63 102/75 120/76  Pulse: (!) 58 67 68 (!) 59  Resp: 18 18 18 18   Temp:   97.8 F (36.6 C)   TempSrc:   Oral   SpO2: 100% 100% 99%   Weight:      Height:        1050 SVE: 7, 80, -1 AROM: clear fluid   FHT: baseline rate 125, moderate varibility, +acel, no decel Toco: every 1-3 minutes lasting 50-60 seconds  Pit:  56mu/min   Labs: Lab Results  Component Value Date   WBC 9.3 11/20/2018   HGB 12.8 11/20/2018   HCT 38.5 11/20/2018   MCV 97.0 11/20/2018   PLT 177 11/20/2018    Patient Active Problem List   Diagnosis Date Noted  . Post-dates pregnancy 11/20/2018  . History of C-section 05/10/2018  . Supervision of normal pregnancy 05/10/2018  . Eye lesion 01/07/2017  . Language barrier 08/22/2015    Assessment / Plan: 23 y.o. [id number] at [redacted]w[redacted]d here for IOL for post dates.   TOLAC Sev. Pre-e: mild range pressures, no BP's treated overnight.   Decreased urine output early this am, PEC labs resent and wnl, s/p 500 ml bolus. 40 ml urine output in the last hour. To give another 500 ml IV bolus. Labor: progressing as expected. AROM'd. Will recheck in 2 hours Fetal Wellbeing:  Category 1 Pain Control:  Epidural  Anticipated MOD: SVD   11/20/2018, 10:48 AM

## 2018-11-20 NOTE — Progress Notes (Signed)
Plan of care discussed with RN. Initially BP removed after pain control but now resting and again having severe range BP. Will start Mg++ and labetalol protocol for severe preeclampsia.   Cristal Deer. Earlene Plater, DO OB/GYN Fellow

## 2018-11-20 NOTE — Op Note (Signed)
Klarissa Siefer PROCEDURE DATE: 11/20/2018  PREOPERATIVE DIAGNOSES: Intrauterine pregnancy at [redacted]w[redacted]d weeks gestation; arrest of dilation, 2 prior c-section  POSTOPERATIVE DIAGNOSES: The same  PROCEDURE: Repeat Low Transverse Cesarean Section  SURGEON:  Baldemar Lenis, MD  ASSISTANT:  none  ANESTHESIOLOGY TEAM: Anesthesiologist: Mal Amabile, MD CRNA: Graciela Husbands, CRNA  INDICATIONS: Sally Hampton is a 23 y.o. [phone] at [redacted]w[redacted]d here for cesarean section secondary to the indications listed under preoperative diagnoses; please see preoperative note for further details.  The risks of cesarean section were discussed with the patient including but were not limited to: bleeding which may require transfusion or reoperation; infection which may require antibiotics; injury to bowel, bladder, ureters or other surrounding organs; injury to the fetus; need for additional procedures including hysterectomy in the event of a life-threatening hemorrhage; placental abnormalities wth subsequent pregnancies, incisional problems, thromboembolic phenomenon and other postoperative/anesthesia complications.  She consents to blood transfusion in the event of an emergency. The patient verbalized understanding of the plan, giving informed written consent for the procedure.    FINDINGS:  Viable female infant in cephalic presentation.  Apgars 8 and 9.  Clear amniotic fluid.  Intact placenta, three vessel cord.  Normal uterus with mid to lower uterine band around anterior uterus, bladder tacked onto uterus, normal fallopian tubes and ovaries bilaterally.  ANESTHESIA: Epidural  INTRAVENOUS FLUIDS: 800 ml   ESTIMATED BLOOD LOSS: 300 ml URINE OUTPUT:  325 ml SPECIMENS: Placenta sent to L&D COMPLICATIONS: None immediate  PROCEDURE IN DETAIL:  The patient preoperatively received intravenous antibiotics and had sequential compression devices applied to her lower extremities.  She was then taken to the operating room  where the epidural anesthesia was dosed up to surgical level and was found to be adequate. She was then placed in a dorsal supine position with a leftward tilt, and prepped and draped in a sterile manner.  A foley catheter was placed into her bladder with sterile technique and attached to constant gravity.  After a timeout was performed, a Pfannenstiel skin incision was made with scalpel over her preexisting scar and carried through to the underlying layer of fascia. The fascia was incised in the midline, and this incision was extended bilaterally using the Mayo scissors.  Kocher clamps were applied to the superior aspect of the fascial incision and the underlying rectus muscles were dissected off bluntly.  A similar process was carried out on the inferior aspect of the fascial incision. The rectus muscles were separated in the midline bluntly and the peritoneum was entered bluntly. The peritoneal incision was carefully extended bluntly laterally and caudad with good visualization of the bladder. The uterus appeared as above. The Alexis O-ring retractor was placed into the incision, taking care not to incorporate bowel or omentum. A bladder flap was created using the metzenbaum scissors and the bladder gently dissected downward. Attention was turned to the lower uterine segment where a low transverse hysterotomy was made with a scalpel and extended bilaterally bluntly.  The infant was delivered from LOA position, nose and mouth were bulb suctioned, and the cord clamped and cut after 1 minute. The infant was then handed over to the waiting neonatology team. Uterine massage was then performed, and the placenta delivered intact with a three-vessel cord. The uterus was then cleared of clots and debris.  The hysterotomy was closed with 0 Vicryl in a running locked fashion, and an imbricating layer was also placed with 0 Vicryl. The fallopian tubes and ovaries were visualized bilaterally and  normal appearing. The pelvis  was cleared of all clot and debris. Arista placed over the hysterotomy and where the bladder was dissected down. The Alexis retractor was removed.  Hemostasis was again confirmed on all surfaces. The peritoneum was re-approximated using 2-0 Vicryl suture. The fascia was then closed using 0 Vicryl in a running fashion.  The subcutaneous layer was irrigated, then reapproximated with 2-0 plain gut, and 20 ml of 0.5% Marcaine was injected subcutaneously around the incision.  The skin was closed with a 4-0 Monocryl subcuticular stitch. The patient tolerated the procedure well. Sponge, lap, instrument and needle counts were correct x 3.  She was taken to the recovery room in stable condition.    Baldemar Lenis, M.D. Attending Obstetrician & Gynecologist, [facility] for [employer], [facility]

## 2018-11-20 NOTE — Anesthesia Pain Management Evaluation Note (Signed)
  CRNA Pain Management Visit Note  Patient: Sally Hampton, 23 y.o., female  "Hello I am a member of the anesthesia team at [facility]. We have an anesthesia team available at all times to provide care throughout the hospital, including epidural management and anesthesia for C-section. I don't know your plan for the delivery whether it a natural birth, water birth, IV sedation, nitrous supplementation, doula or epidural, but we want to meet your pain goals."   1.Was your pain managed to your expectations on prior hospitalizations?   Yes  2.What is your expectation for pain management during this hospitalization?     Epidural  3.How can we help you reach that goal? Epidural placed ~0730.  Record the patient's initial score and the patient's pain goal.   Pain: 0  Pain Goal: 0 [facility] wants you to be able to say your pain was always managed very well.  Jorryn Casagrande 11/20/2018

## 2018-11-20 NOTE — Progress Notes (Signed)
[employer] interpreter utilized. [id number].

## 2018-11-20 NOTE — Progress Notes (Signed)
OB/GYN Faculty Practice: Labor Progress Note  Subjective: Strip note. Plan of care discussed with RN. Patient with pain and elevated BP. Per RN, patient walking around with arm bent during last two elevated BP in severe range. Also in lots of pain, difficult to communicate importance of relaxing arm due to language barrier.  Objective: BP (!) 153/111   Pulse 78   Temp 99 F (37.2 C) (Oral)   Resp 20   Ht 5\' 1"  (1.549 m)   Wt 59.4 kg   LMP 02/28/2018   BMI 24.75 kg/m  Gen: strip note Dilation: 2.5 Exam by:: Dr. Nicki Reaper  Assessment and Plan: 23 y.o. L3Y1017 [redacted]w[redacted]d here for PDIOL and elevated BP on arrival.   Labor: Induction with outpatient FB, pitocin started low-dose 1 x 1 on arrival. -- pain control: going to try IV fentanyl -- PPH Risk: medium  Elevated BP: Most likely new diagnosis of gestational HTN. HELLP labs wnl, UPC not yet collected. Asymptomatic in terms of headaches, vision changes.  -- continue to monitor closely -- will try to treat pain and if still severe range BP, will start Mg++ for presumed severe preeclampsia   Fetal Well-Being:  Cephalic by BSUS.  -- Category I - continuous fetal monitoring  -- GBS negative   Danarius Mcconathy S. Earlene Plater, DO OB/GYN Fellow, Faculty Practice  4:43 AM

## 2018-11-20 NOTE — Progress Notes (Signed)
Foley bulb placed 10:00am on 11/19/18. [facility].

## 2018-11-20 NOTE — Anesthesia Procedure Notes (Signed)
Epidural Patient location during procedure: OB Start time: 11/20/2018 7:20 AM End time: 11/20/2018 7:30 AM  Staffing Anesthesiologist: Mal Amabile, MD Performed: anesthesiologist   Preanesthetic Checklist Completed: patient identified, site marked, surgical consent, pre-op evaluation, timeout performed, IV checked, risks and benefits discussed and monitors and equipment checked  Epidural Patient position: sitting Prep: site prepped and draped and DuraPrep Patient monitoring: continuous pulse ox and blood pressure Approach: midline Location: L3-L4 Injection technique: LOR air  Needle:  Needle type: Tuohy  Needle gauge: 17 G Needle length: 9 cm and 9 Needle insertion depth: 4 cm Catheter type: closed end flexible Catheter size: 19 Gauge Catheter at skin depth: 9 cm Test dose: negative and Other  Assessment Events: blood not aspirated, injection not painful, no injection resistance, negative IV test and no paresthesia  Additional Notes Patient identified. Risks and benefits discussed including failed block, incomplete  Pain control, post dural puncture headache, nerve damage, paralysis, blood pressure Changes, nausea, vomiting, reactions to medications-both toxic and allergic and post Partum back pain. All questions were answered. Patient expressed understanding and wished to proceed. Sterile technique was used throughout procedure. Epidural site was Dressed with sterile barrier dressing. No paresthesias, signs of intravascular injection Or signs of intrathecal spread were encountered.  Patient was more comfortable after the epidural was dosed. Please see RN's note for documentation of vital signs and FHR which are stable. Interpreter used throughout procedure.Reason for block:procedure for pain

## 2018-11-20 NOTE — Progress Notes (Addendum)
LABOR PROGRESS NOTE  Sally Hampton is a 23 y.o. [id number] at [redacted]w[redacted]d  admitted for IOL for post dates.   Subjective: Doing well, no complaints.   Objective: BP 113/71   Pulse 61   Temp (!) 97.2 F (36.2 C) (Axillary)   Resp 18   Ht 5\' 1"  (1.549 m)   Wt 59.4 kg   LMP 02/28/2018   SpO2 100%   BMI 24.75 kg/m  or  Vitals:   11/20/18 1045 11/20/18 1100 11/20/18 1200 11/20/18 1230  BP:  107/64 115/75 113/71  Pulse:  70 65 61  Resp:  18 18 18   Temp: 97.9 F (36.6 C)   (!) 97.2 F (36.2 C)  TempSrc: Oral   Axillary  SpO2: 100% 100% 100% 100%  Weight:      Height:        1300 Dilation: 7 Effacement (%): 80 Station: -1 Presentation: Vertex Exam by:: Judeth Cornfield, MD FHT: baseline rate 120, moderate varibility, +acel, no decel IUPC: MVU 160s, every 1-4 min lasting 40-60 sec Pit: 12 mu/min    Labs: Lab Results  Component Value Date   WBC 9.3 11/20/2018   HGB 12.8 11/20/2018   HCT 38.5 11/20/2018   MCV 97.0 11/20/2018   PLT 177 11/20/2018    Patient Active Problem List   Diagnosis Date Noted  . Post-dates pregnancy 11/20/2018  . History of C-section 05/10/2018  . Supervision of normal pregnancy 05/10/2018  . Eye lesion 01/07/2017  . Language barrier 08/22/2015    Assessment / Plan: 23 y.o. [id number] at [redacted]w[redacted]d here for IOL for post dates  TOLAC   Sev Pre e: mild range BP's  Labor: no cervical dilation since 1045. IUPC placed. Quarter sized clot noted with cervical exam, will continue to monitor. MVU's inadequate. Continue titrating pitocin as long a fetus tolerates.  Fetal Wellbeing:  Category 1 Pain Control:  Epidural   Anticipated MOD:  SVD    11/20/2018, 1:16 PM

## 2018-11-20 NOTE — Progress Notes (Signed)
Utilized [employer] interpreter. [id number] and [id number].

## 2018-11-20 NOTE — Anesthesia Preprocedure Evaluation (Addendum)
Anesthesia Evaluation  Patient identified by MRN, date of birth, ID band Patient awake    Reviewed: Allergy & Precautions, NPO status , Patient's Chart, lab work & pertinent test results  Airway Mallampati: II  TM Distance: >3 FB Neck ROM: Full    Dental no notable dental hx. (+) Teeth Intact   Pulmonary neg pulmonary ROS,    Pulmonary exam normal breath sounds clear to auscultation       Cardiovascular hypertension, Normal cardiovascular exam Rhythm:Regular Rate:Normal  On MgSO4   Neuro/Psych negative neurological ROS  negative psych ROS   GI/Hepatic negative GI ROS, Neg liver ROS,   Endo/Other  negative endocrine ROS  Renal/GU negative Renal ROS  negative genitourinary   Musculoskeletal negative musculoskeletal ROS (+)   Abdominal   Peds  Hematology negative hematology ROS (+)   Anesthesia Other Findings   Reproductive/Obstetrics (+) Pregnancy Previous C/Section x 2 Pre eclampsia on MgSO4                            Anesthesia Physical Anesthesia Plan  ASA: III and emergent  Anesthesia Plan: Epidural   Post-op Pain Management:    Induction:   PONV Risk Score and Plan: 4 or greater and Ondansetron, Dexamethasone, Treatment may vary due to age or medical condition and Scopolamine patch - Pre-op  Airway Management Planned: Natural Airway  Additional Equipment:   Intra-op Plan:   Post-operative Plan:   Informed Consent: I have reviewed the patients History and Physical, chart, labs and discussed the procedure including the risks, benefits and alternatives for the proposed anesthesia with the patient or authorized representative who has indicated his/her understanding and acceptance.     Dental advisory given  Plan Discussed with: Anesthesiologist, CRNA and Surgeon  Anesthesia Plan Comments: (Patient is going for C/Section for arrest of dilation. Will use epidural for  C/Section. M. Malen Gauze, MD)       Anesthesia Quick Evaluation

## 2018-11-20 NOTE — Consult Note (Signed)
Neonatology Note:   Attendance at C-section:    I was asked by Dr. Davis to attend this repeat C/S at term after failed TOLAC, due to failure of descent. The mother is a G4P2A1 O pos, GBS neg with gestational HTN, on Magnesium sulfate and Labetalol. ROM 8.5 hours before delivery, fluid clear. Infant vigorous with good spontaneous cry and tone. Delayed cord clamping was done. Needed only minimal bulb suctioning. Baby was slow to pink up, so we checked her O2 sat at 4 minutes and it was 55%. 25% supplemental O2 was given by loose neopuff mask and O2 sats came up to desired parameters. We weaned the O2 away and observed her for several minutes in room air, maintained O2 sats > 90%. Ap 8/8/9. Lungs clear to ausc in DR. Infant is able to remain with her mother for skin to skin time under nursing supervision. Transferred to the care of Pediatrician.   Jala Dundon C. Zyaire Mccleod, MD 

## 2018-11-20 NOTE — Progress Notes (Signed)
Faculty Note  In to room for discussion regarding arrest. Patient has been unchanged since AROM 6 hours ago, remains 7 cm despite pitocin augmentation. Reviewed appropriate labor curve and that it is unlikely she will make further change. Unable to augment further given 2 prior c-sections. Recommended proceeding with repeat c-section.   The risks of cesarean section were discussed with the patient; including but not limited to: infection which may require antibiotics; bleeding which may require transfusion or re-operation; injury to bowel, bladder, ureters or other surrounding organs; need for additional procedures including hysterectomy in the event of a life-threatening hemorrhage; placental abnormalities wth subsequent pregnancies, incisional problems, thromboembolic phenomenon and other postoperative/anesthesia complications. Answered all questions. The patient verbalized understanding of the plan, giving informed consent for the procedure. She is agreeable to blood transfusion in the event of emergency.  Patient has been NPO since arrival, she will remain NPO for procedure Anesthesia and OR aware Preoperative prophylactic antibiotics and SCDs ordered on call to the OR  To OR when ready Cont MgSO4   Live Swahili interpretor used  Baldemar Lenis, M.D. Attending [facility])

## 2018-11-20 NOTE — H&P (Addendum)
LABOR AND DELIVERY ADMISSION HISTORY AND PHYSICAL NOTE  Sally KaufmanRachel Hampton is a 23 y.o. female [phone] with IUP at 4634w6d by --/22 presenting for IOL after outpatient FB placement for post dates. She reports positive fetal movement. She denies leakage of fluid or vaginal bleeding.  Prenatal History/Complications: [facility] Pregnancy complications:  - Hx of Prior C-section x2 - TOLAC consent on file 08/04/18 (1st failure to progress, 2nd elective) - Post Dates - Language Barrier - speaks Swahili  Past Medical History: History reviewed. No pertinent past medical history.  Past Surgical History: Past Surgical History:  Procedure Laterality Date  . CESAREAN SECTION    . WISDOM TOOTH EXTRACTION  2016    Obstetrical History: OB History    Gravida  4   Para  2   Term  2   Preterm      AB  1   Living  2     SAB  1   TAB      Ectopic      Multiple      Live Births  2           Social History: Social History   Socioeconomic History  . Marital status: Married    Spouse name: Not on file  . Number of children: Not on file  . Years of education: Not on file  . Highest education level: Not on file  Occupational History  . Not on file  Social Needs  . Financial resource strain: Not on file  . Food insecurity:    Worry: Sometimes true    Inability: Sometimes true  . Transportation needs:    Medical: No    Non-medical: No  Tobacco Use  . Smoking status: Never Smoker  . Smokeless tobacco: Never Used  Substance and Sexual Activity  . Alcohol use: Not Currently    Alcohol/week: 0.0 standard drinks    Comment: not in pregnancy  . Drug use: Never  . Sexual activity: Yes    Birth control/protection: None  Lifestyle  . Physical activity:    Days per week: Not on file    Minutes per session: Not on file  . Stress: Not on file  Relationships  . Social connections:    Talks on phone: Not on file    Gets together: Not on file     Attends religious service: Not on file    Active member of club or organization: Not on file    Attends meetings of clubs or organizations: Not on file    Relationship status: Not on file  Other Topics Concern  . Not on file  Social History Narrative  . Not on file    Family History: Family History  Problem Relation Age of Onset  . Hypertension Mother     Allergies: No Known Allergies  Medications Prior to Admission  Medication Sig Dispense Refill Last Dose  . Elastic Bandages & Supports (COMFORT FIT MATERNITY SUPP SM) MISC 1 Units by Does not apply route daily as needed. (Patient not taking: Reported on 10/27/2018) 1 each 0 Not Taking  . Prenatal Vit-Fe Phos-FA-Omega (VITAFOL GUMMIES) 3.33-0.333-34.8 MG CHEW Chew 3 each by mouth daily. (Patient not taking: Reported on 11/19/2018) 90 tablet 11 Not Taking     Review of Systems  All systems reviewed and negative except as stated in HPI  Physical Exam Blood pressure (!) 152/100, pulse 61, temperature 99 F (37.2 C), temperature source Oral, resp. rate 18, height 5'  1" (1.549 m), weight 59.4 kg, last menstrual period 02/28/2018. General appearance: alert, oriented, NAD Lungs: normal respiratory effort Heart: regular rate Abdomen: soft, non-tender; gravid, FH appropriate for GA Extremities: No calf swelling or tenderness Presentation: cephalic by BSUS Fetal monitoring: baseline 125bpm, moderate variability, 15x15 accels, no decels Uterine activity: contractions Q5-7 min Dilation: 2.5 Exam by:: Dr. Nicki ReaperKellog  Prenatal labs: ABO, Rh: --/--/O POS (02/08 0040) Antibody: NEG (02/08 0040) Rubella: 21.20 (07/29 1517) RPR: Non Reactive (11/05 0840)  HBsAg: Negative (07/29 1517)  HIV: Non Reactive (11/05 0840)  GC/Chlamydia: neg/neg GBS:   negative 2-hr GTT: 71, 78, 77 Genetic screening:  negative Anatomy US: normal  Prenatal Transfer Tool  Maternal Diabetes: No Genetic Screening: Normal Maternal Ultrasounds/Referrals:  Normal Fetal Ultrasounds or other Referrals:  None Maternal Substance Abuse:  No Significant Maternal Medications:  None Significant Maternal Lab Results: Lab values include: Group B Strep negative  Results for orders placed or performed during the hospital encounter of 11/20/18 (from the past 24 hour(s))  CBC   Collection Time: 11/20/18 12:40 AM  Result Value Ref Range   WBC 7.0 4.0 - 10.5 K/uL   RBC 3.86 (L) 3.87 - 5.11 MIL/uL   Hemoglobin 12.4 12.0 - 15.0 g/dL   HCT 16.137.0 09.636.0 - 04.546.0 %   MCV 95.9 80.0 - 100.0 fL   MCH 32.1 26.0 - 34.0 pg   MCHC 33.5 30.0 - 36.0 g/dL   RDW 40.912.7 81.111.5 - 91.415.5 %   Platelets 174 150 - 400 K/uL  Type and screen   Collection Time: 11/20/18 12:40 AM  Result Value Ref Range   ABO/RH(D) O POS    Antibody Screen NEG    Sample Expiration      11/23/2018 Performed at [facility], [address] [place], [place] [id number]   Comprehensive metabolic panel   Collection Time: 11/20/18 12:43 AM  Result Value Ref Range   Sodium 132 (L) 135 - 145 mmol/L   Potassium 4.1 3.5 - 5.1 mmol/L   Chloride 108 98 - 111 mmol/L   CO2 19 (L) 22 - 32 mmol/L   Glucose, Bld 82 70 - 99 mg/dL   BUN 12 6 - 20 mg/dL   Creatinine, Ser 6.210.79 0.44 - 1.00 mg/dL   Calcium 8.5 (L) 8.9 - 10.3 mg/dL   Total Protein 6.5 6.5 - 8.1 g/dL   Albumin 3.3 (L) 3.5 - 5.0 g/dL   AST 23 15 - 41 U/L   ALT 9 0 - 44 U/L   Alkaline Phosphatase 215 (H) 38 - 126 U/L   Total Bilirubin 0.5 0.3 - 1.2 mg/dL   GFR calc non Af Amer >60 >60 mL/min   GFR calc Af Amer >60 >60 mL/min   Anion gap 5 5 - 15    Patient Active Problem List   Diagnosis Date Noted  . Post-dates pregnancy 11/20/2018  . History of C-section 05/10/2018  . Supervision of normal pregnancy 05/10/2018  . Eye lesion 01/07/2017  . Language barrier 08/22/2015    Assessment: Sally KaufmanRachel Hampton is a 23 y.o. [id number] at 2315w6d here for PDIOL after outpatient FB placement for post dates.   #Labor: Latent, FB still in, starting  pitocin - will stop at 10 and then 2x2 once FB out #Pain: Has not decided #FWB: Cat 1 #Presentation: Vertex by US #ID:  GBS negative #MOF: Breast and Bottle #MOC: undecided; does NOT want depo-provera #Circ:  N/A #PPH Risk: Medium   Attestation:I agree with the resident's documentation. We  have discussed the plan of care. Will obtain CMP, CBC and UPC given elevated BP. If needing to treat severe range BP, will start Mg++.   Cristal Deer. Earlene Plater, DO OB/GYN Fellow

## 2018-11-20 NOTE — Transfer of Care (Signed)
Immediate Anesthesia Transfer of Care Note  Patient: Sally Hampton  Procedure(s) Performed: CESAREAN SECTION (N/A )  Patient Location: PACU  Anesthesia Type:Epidural  Level of Consciousness: awake  Airway & Oxygen Therapy: Patient Spontanous Breathing  Post-op Assessment: Report given to RN and Post -op Vital signs reviewed and stable  Post vital signs: stable  Last Vitals:  Vitals Value Taken Time  BP 154/109 11/20/2018  6:54 PM  Temp    Pulse 53 11/20/2018  6:59 PM  Resp 16 11/20/2018  6:59 PM  SpO2 99 % 11/20/2018  6:59 PM  Vitals shown include unvalidated device data.  Last Pain:  Vitals:   11/20/18 1630  TempSrc: Oral  PainSc: 0-No pain      Patients Stated Pain Goal: 0 (11/20/18 0725)  Complications: No apparent anesthesia complications

## 2018-11-20 NOTE — Progress Notes (Signed)
LABOR PROGRESS NOTE  Sally Hampton is a 23 y.o. [id number] at [redacted]w[redacted]d  admitted for IOL for post dates.   Subjective: Doing well, comfortable with epidural   Objective: BP 124/80   Pulse (!) 56   Temp (!) 97.2 F (36.2 C) (Axillary)   Resp 18   Ht 5\' 1"  (1.549 m)   Wt 59.4 kg   LMP 02/28/2018   SpO2 100%   BMI 24.75 kg/m  or  Vitals:   11/20/18 1330 11/20/18 1400 11/20/18 1430 11/20/18 1505  BP: 118/65 110/63 (!) 104/58 124/80  Pulse: (!) 57 (!) 57 (!) 56 (!) 56  Resp: 18 18 18 18   Temp:   (!) 97.2 F (36.2 C)   TempSrc:   Axillary   SpO2: 99% 99% 100%   Weight:      Height:        1542 Dilation: 7 Effacement (%): 80 Station: -1 Presentation: Vertex Exam by:: Rayfield Citizen, CNM  FHT: baseline rate 120, moderate varibility, +acel, no decel IUPC: MVU's 160-180, every 2-7 minutes lasting 50-90 seconds Pitocin: 16 mu/min   Labs: Lab Results  Component Value Date   WBC 9.3 11/20/2018   HGB 12.8 11/20/2018   HCT 38.5 11/20/2018   MCV 97.0 11/20/2018   PLT 177 11/20/2018    Patient Active Problem List   Diagnosis Date Noted  . Post-dates pregnancy 11/20/2018  . History of C-section 05/10/2018  . Supervision of normal pregnancy 05/10/2018  . Eye lesion 01/07/2017  . Language barrier 08/22/2015    Assessment / Plan: 23 y.o. [id number] at [redacted]w[redacted]d here for IOL for post dates  TOLAC   Severe pre-e: mild range BP's  Urine output adequate  Labor: no cervical dilation, discussed possibility of csection in presence of no cervical change and safety of continuing to titrate pitocin with history of 2 prior csections, pt requesting to wait a few more hours before csection. After discussion with Dr. Alysia Penna we agreed to continue to titrate pitocin up to 65mu/min as long as fetus tolerates. Will recheck in 2 hours.  Fetal Wellbeing:  Category 1 Pain Control:  Epidural   11/20/2018, 3:42 PM

## 2018-11-21 ENCOUNTER — Inpatient Hospital Stay (HOSPITAL_COMMUNITY): Payer: BLUE CROSS/BLUE SHIELD

## 2018-11-21 ENCOUNTER — Encounter (HOSPITAL_COMMUNITY): Payer: Self-pay

## 2018-11-21 LAB — CBC
HCT: 33.3 % — ABNORMAL LOW (ref 36.0–46.0)
Hemoglobin: 11.2 g/dL — ABNORMAL LOW (ref 12.0–15.0)
MCH: 32.5 pg (ref 26.0–34.0)
MCHC: 33.6 g/dL (ref 30.0–36.0)
MCV: 96.5 fL (ref 80.0–100.0)
PLATELETS: 160 10*3/uL (ref 150–400)
RBC: 3.45 MIL/uL — ABNORMAL LOW (ref 3.87–5.11)
RDW: 13.2 % (ref 11.5–15.5)
WBC: 15.4 10*3/uL — AB (ref 4.0–10.5)
nRBC: 0 % (ref 0.0–0.2)

## 2018-11-21 MED ORDER — SIMETHICONE 80 MG PO CHEW
80.0000 mg | CHEWABLE_TABLET | Freq: Four times a day (QID) | ORAL | Status: DC
  Administered 2018-11-21 – 2018-11-22 (×5): 80 mg via ORAL
  Filled 2018-11-21 (×5): qty 1

## 2018-11-21 NOTE — Addendum Note (Signed)
Addendum  created 11/21/18 0750 by Rica Records, CRNA   Clinical Note Signed

## 2018-11-21 NOTE — Anesthesia Postprocedure Evaluation (Signed)
Anesthesia Post Note  Patient: Sally Hampton  Procedure(s) Performed: CESAREAN SECTION (N/A )     Patient location during evaluation: Mother Baby Anesthesia Type: Epidural Level of consciousness: awake and alert Pain management: pain level controlled Vital Signs Assessment: post-procedure vital signs reviewed and stable Respiratory status: spontaneous breathing, nonlabored ventilation and respiratory function stable Cardiovascular status: stable Postop Assessment: no headache, no backache, epidural receding and patient able to bend at knees Anesthetic complications: no    Last Vitals:  Vitals:   11/21/18 0500 11/21/18 0605  BP:    Pulse:    Resp: 18 18  Temp:    SpO2:      Last Pain:  Vitals:   11/21/18 0605  TempSrc:   PainSc: 0-No pain   Pain Goal: Patients Stated Pain Goal: 0 (11/20/18 0725)                 Rica Records

## 2018-11-21 NOTE — Lactation Note (Signed)
This note was copied from a baby's chart. Lactation Consultation Note  Patient Name: Sally Hampton [id number] Date: 11/21/2018 Reason for consult: Initial assessment;Term P3, 7 hour female infant. Interpreter used Swahili [phone] Mom feeding choice at admission was breastfeeding and supplementing with formula. Per mom, she is active on the [facility] program in [place]. Per mom, infant had 2 voids and 2 stools since delivery. Per mom, she breastfeed her previous two children for 3 years each. Mom demonstrated hand  expression a smear of colostrum present at this time.  Mom  latched on her  left breast using the  Football hold position, infant latched with ease, mouth wide, lower jaw extended. Infant breastfeed for 20 minutes. Nurse supplemented infant afterwards with 7 ml of Gerber Gentle with iron 20 kcal. LC discussed mom will breastfeed according hunger cues and not exceed 3 hours without breastfeeding infant. Mom shown how to use DEBP & how to disassemble, clean, & reassemble parts. LC discussed I & O. Mom knows to call Nurse or LC if she has any further questions, concerns or needs assistance with latching infant to breast. Mom made aware of O/P services, breastfeeding support groups, community resources, and our phone # for post-discharge questions.  Mom's Plan: 1. Breastfeed infant according hunger cues and not exceed 3 hours without breastfeeding infant. 2. Will pump every 3 hours on initial setting for 15 minutes. 3. Will supplement with formula after breastfeeding infant according age/ hours of life until milk supply increases in volume.  Maternal Data Formula Feeding for Exclusion: Yes Reason for exclusion: Mother's choice to formula and breast feed on admission Has patient been taught Hand Expression?: Yes(Smear of colostrum present at this time.) Does the patient have breastfeeding experience prior to this delivery?: Yes  Feeding Feeding Type: Breast Fed  LATCH  Score Latch: Grasps breast easily, tongue down, lips flanged, rhythmical sucking.  Audible Swallowing: A few with stimulation  Type of Nipple: Everted at rest and after stimulation  Comfort (Breast/Nipple): Soft / non-tender  Hold (Positioning): Assistance needed to correctly position infant at breast and maintain latch.  LATCH Score: 8  Interventions Interventions: Breast feeding basics reviewed;Assisted with latch;Skin to skin;Breast compression;Adjust position;Breast massage;Hand express;Support pillows;Position options;Expressed milk;DEBP  Lactation Tools Discussed/Used WIC Program: Yes Pump Review: Setup, frequency, and cleaning;Milk Storage Initiated by:: Danelle Earthly, IBCLC Date initiated:: 11/21/18   Consult Status Consult Status: Follow-up Date: 11/22/18 Follow-up type: In-patient    Danelle Earthly 11/21/2018, 1:12 AM

## 2018-11-21 NOTE — Progress Notes (Signed)
Faculty Attending Note  Post Op Day 1  Subjective: Patient is feeling well. She reports very well controlled pain on PO pain meds. She is not ambulating yet and denies light-headedness or dizziness. She is voiding spontaneously. She is not passing flatus, has not had a BM yet. She is tolerating a regular diet without nausea/vomiting. Bleeding is moderate. She is breast feeding. Baby is in room and doing well.  Objective: Blood pressure 120/69, pulse (!) 58, temperature 98.5 F (36.9 C), resp. rate 18, height 5\' 1"  (1.549 m), weight 59.4 kg, last menstrual period 02/28/2018, SpO2 97 %, unknown if currently breastfeeding. Temp:  [97.2 F (36.2 C)-98.5 F (36.9 C)] 98.5 F (36.9 C) (02/09 0342) Pulse Rate:  [52-74] 58 (02/09 0342) Resp:  [12-22] 18 (02/09 0605) BP: (102-154)/(58-109) 120/69 (02/09 0342) SpO2:  [97 %-100 %] 97 % (02/09 0342)  Physical Exam:  General: alert, oriented, cooperative Chest: normal respiratory effort Heart: RRR  Abdomen: soft, distended, appropriately tender to palpation, incision covered by dressing with no evidence of active bleeding  Uterine Fundus: firm, 2 fingers below the umbilicus Lochia: moderate, rubra DVT Evaluation: no evidence of DVT Extremities: no edema, no calf tenderness  UOP: appropriate   Current Facility-Administered Medications:  .  0.9 %  sodium chloride infusion, , Intravenous, Continuous, Conan Bowens, MD .  acetaminophen (TYLENOL) tablet 650 mg, 650 mg, Oral, Q4H PRN, Conan Bowens, MD .  coconut oil, 1 application, Topical, PRN, Conan Bowens, MD .  witch hazel-glycerin (TUCKS) pad 1 application, 1 application, Topical, PRN **AND** dibucaine (NUPERCAINAL) 1 % rectal ointment 1 application, 1 application, Rectal, PRN, Conan Bowens, MD .  diphenhydrAMINE (BENADRYL) capsule 25 mg, 25 mg, Oral, Q6H PRN, Conan Bowens, MD .  enoxaparin (LOVENOX) injection 40 mg, 40 mg, Subcutaneous, Q24H, Conan Bowens, MD .  labetalol  (NORMODYNE,TRANDATE) injection 20 mg, 20 mg, Intravenous, PRN, 20 mg at 11/20/18 0546 **AND** labetalol (NORMODYNE,TRANDATE) injection 40 mg, 40 mg, Intravenous, PRN, 40 mg at 11/20/18 0630 **AND** labetalol (NORMODYNE,TRANDATE) injection 80 mg, 80 mg, Intravenous, PRN **AND** hydrALAZINE (APRESOLINE) injection 10 mg, 10 mg, Intravenous, PRN **AND** Measure blood pressure, , , Once, Conan Bowens, MD .  HYDROmorphone (DILAUDID) injection 0.5 mg, 0.5 mg, Intravenous, Q3H PRN, Conan Bowens, MD .  ketorolac (TORADOL) 30 MG/ML injection 30 mg, 30 mg, Intravenous, Q6H PRN, 30 mg at 11/20/18 2309 **OR** ketorolac (TORADOL) 30 MG/ML injection 30 mg, 30 mg, Intramuscular, Q6H PRN, Mal Amabile, MD .  magnesium sulfate 40 grams in LR 500 mL OB infusion, 2 g/hr, Intravenous, Continuous, Conan Bowens, MD, Last Rate: 25 mL/hr at 11/21/18 0700, 2 g/hr at 11/21/18 0700 .  menthol-cetylpyridinium (CEPACOL) lozenge 3 mg, 1 lozenge, Oral, Q2H PRN, Conan Bowens, MD .  naloxone [facility]) injection 0.4 mg, 0.4 mg, Intravenous, PRN **AND** sodium chloride flush (NS) 0.9 % injection 3 mL, 3 mL, Intravenous, PRN, Mal Amabile, MD .  ondansetron [facility]) injection 4 mg, 4 mg, Intravenous, Q8H PRN, Mal Amabile, MD .  oxyCODONE (Oxy IR/ROXICODONE) immediate release tablet 5-10 mg, 5-10 mg, Oral, Q4H PRN, Conan Bowens, MD .  oxytocin (PITOCIN) IV infusion 40 units in NS 1000 mL - Premix, 2.5 Units/hr, Intravenous, Continuous, Conan Bowens, MD, Last Rate: 62.5 mL/hr at 11/20/18 2100, 2.5 Units/hr at 11/20/18 2100 .  prenatal multivitamin tablet 1 tablet, 1 tablet, Oral, Q1200, Conan Bowens, MD .  senna-docusate (Senokot-S) tablet 2 tablet, 2 tablet, Oral, Q24H,  Conan Bowens, MD .  simethicone [facility]) chewable tablet 80 mg, 80 mg, Oral, QID, Conan Bowens, MD .  Tdap [facility]) injection 0.5 mL, 0.5 mL, Intramuscular, Once, Conan Bowens, MD .  zolpidem [facility]) tablet 5 mg, 5 mg, Oral, QHS PRN, Conan Bowens, MD Recent Labs    11/20/18 1929 11/21/18 0603  HGB 13.0 11.2*  HCT 39.3 33.3*    Assessment/Plan:  Patient is 23 y.o. [id number] POD#1 s/p RCS at [redacted]w[redacted]d for arrest of dilation. She is doing very well, recovering appropriately and complains only of minor pain. Stomach very distended, has not passed gas yet, will schedule simethicone. MgSO4 on until 6 pm tonight for preeclampsia with severe features. Overall, doing well.  Continue routine post partum care Pain meds prn Regular diet undecided for birth control Cont MgSO4 until 6 pm Plan for discharge possibly tomorrow  Swahili interpretor used for interview   K. Therese Sarah, M.D. Attending [facility] [employer] [facility] (Faculty Practice)  11/21/2018, 7:38 AM

## 2018-11-22 DIAGNOSIS — O1414 Severe pre-eclampsia complicating childbirth: Secondary | ICD-10-CM | POA: Diagnosis present

## 2018-11-22 MED ORDER — IBUPROFEN 600 MG PO TABS: 600.0000 mg | ORAL_TABLET | Freq: Four times a day (QID) | ORAL | 2 refills | Status: DC | PRN

## 2018-11-22 MED ORDER — NALBUPHINE HCL 10 MG/ML IJ SOLN: 5.0000 mg | INTRAMUSCULAR | Status: DC | PRN

## 2018-11-22 MED ORDER — IBUPROFEN 600 MG PO TABS
600.0000 mg | ORAL_TABLET | Freq: Four times a day (QID) | ORAL | Status: DC | PRN
  Administered 2018-11-22: 600 mg via ORAL
  Filled 2018-11-22: qty 1

## 2018-11-22 MED ORDER — DOCUSATE SODIUM 100 MG PO CAPS: 100.0000 mg | ORAL_CAPSULE | Freq: Two times a day (BID) | ORAL | 2 refills | Status: DC | PRN

## 2018-11-22 MED ORDER — AMLODIPINE BESYLATE 5 MG PO TABS: 5.0000 mg | ORAL_TABLET | Freq: Every day | ORAL | 2 refills | Status: DC

## 2018-11-22 MED ORDER — NALOXONE HCL 4 MG/10ML IJ SOLN
1.0000 ug/kg/h | INTRAVENOUS | Status: DC | PRN
  Filled 2018-11-22: qty 5

## 2018-11-22 MED ORDER — OXYCODONE HCL 5 MG PO TABS: 5.0000 mg | ORAL_TABLET | ORAL | 0 refills | Status: DC | PRN

## 2018-11-22 MED ORDER — DIPHENHYDRAMINE HCL 25 MG PO CAPS: 25.0000 mg | ORAL_CAPSULE | ORAL | Status: DC | PRN

## 2018-11-22 MED ORDER — DIPHENHYDRAMINE HCL 50 MG/ML IJ SOLN: 12.5000 mg | INTRAMUSCULAR | Status: DC | PRN

## 2018-11-22 MED ORDER — AMLODIPINE BESYLATE 5 MG PO TABS
5.0000 mg | ORAL_TABLET | Freq: Every day | ORAL | Status: DC
  Administered 2018-11-22: 5 mg via ORAL
  Filled 2018-11-22: qty 1

## 2018-11-22 MED ORDER — NALBUPHINE HCL 10 MG/ML IJ SOLN: 5.0000 mg | Freq: Once | INTRAMUSCULAR | Status: DC | PRN

## 2018-11-22 NOTE — Discharge Summary (Signed)
OB Discharge Summary     Patient Name: Sally Hampton DOB: [birth year] MRN: [id number]  Date of admission: 11/20/2018 Delivering MD: Conan Bowens   Date of discharge: 11/22/2018  Admitting diagnosis: 41 wk induction Intrauterine pregnancy: [redacted]w[redacted]d     Secondary diagnosis:  Principal Problem:   Severe preeclampsia, delivered Active Problems:   Language barrier   S/P cesarean section   Post-dates pregnancy  Additional problems:None     Discharge diagnosis: Term Pregnancy Delivered and Preeclampsia (severe)                                                                                                Post partum procedures:Magnesium sulfate for eclampsia prophylaxis  Augmentation: AROM, Pitocin and Foley Balloon  Complications: None  Hospital course:  Induction of Labor With Cesarean Section  23 y.o. yo [phone] at [redacted]w[redacted]d was admitted to the hospital 11/20/2018 for induction of labor for post-dates. Patient had a labor course significant for development and diagnosis of severe preeclampsia. The patient went for cesarean section due to Prior Uterine Surgery and Arrest of Dilation, and delivered a Viable infant,11/20/2018  Membrane Rupture Time/Date: 10:40 AM ,11/20/2018   Details of operation can be found in separate operative note.  Patient had an uncomplicated postpartum course. She received magnesium sulfate for 24 hours postpartum, was started on Norvasc 5 mg for BP control.  She is ambulating, tolerating a regular diet, passing flatus, and urinating well.  Patient is discharged home in stable condition on 11/22/18.                                    Physical exam  Vitals:   11/21/18 2322 11/22/18 0446 11/22/18 0855 11/22/18 1221  BP: 112/68 132/63 126/78 112/65  Pulse: (!) 53 (!) 51 (!) 47 (!) 58  Resp: 16 17 16 18   Temp: 97.8 F (36.6 C) 99 F (37.2 C) 98.4 F (36.9 C) 98.3 F (36.8 C)  TempSrc: Oral Oral  Oral  SpO2: 100% 100%  100%  Weight:      Height:       General:  alert, cooperative and no distress Lochia: appropriate Uterine Fundus: firm Incision: Healing well with no significant drainage, No significant erythema, Dressing is clean, dry, and intact DVT Evaluation: No evidence of DVT seen on physical exam. Negative Homan's sign. No cords or calf tenderness. Labs: Lab Results  Component Value Date   WBC 15.4 (H) 11/21/2018   HGB 11.2 (L) 11/21/2018   HCT 33.3 (L) 11/21/2018   MCV 96.5 11/21/2018   PLT 160 11/21/2018   CMP Latest Ref Rng & Units 11/20/2018  Glucose 70 - 99 mg/dL -  BUN 6 - 20 mg/dL -  Creatinine 8.33 - 3.83 mg/dL 2.91  Sodium 916 - 606 mmol/L -  Potassium 3.5 - 5.1 mmol/L -  Chloride 98 - 111 mmol/L -  CO2 22 - 32 mmol/L -  Calcium 8.9 - 10.3 mg/dL -  Total Protein 6.5 - 8.1 g/dL -  Total Bilirubin 0.3 -  1.2 mg/dL -  Alkaline Phos 38 - 161126 U/L -  AST 15 - 41 U/L -  ALT 0 - 44 U/L -    Discharge instruction: per After Visit Summary and "Baby and Me Booklet".  After visit meds:  Allergies as of 11/22/2018   No Known Allergies     Medication List    TAKE these medications   amLODipine 5 MG tablet Commonly known as:  NORVASC Take 1 tablet (5 mg total) by mouth daily. Start taking on:  November 23, 2018   docusate sodium 100 MG capsule Commonly known as:  COLACE Take 1 capsule (100 mg total) by mouth 2 (two) times daily as needed for mild constipation or moderate constipation.   ibuprofen 600 MG tablet Commonly known as:  ADVIL,MOTRIN Take 1 tablet (600 mg total) by mouth every 6 (six) hours as needed for fever, headache or moderate pain.   oxyCODONE 5 MG immediate release tablet Commonly known as:  Oxy IR/ROXICODONE Take 1 tablet (5 mg total) by mouth every 4 (four) hours as needed for severe pain or breakthrough pain.            Discharge Care Instructions  (From admission, onward)         Start     Ordered   11/22/18 0000  Discharge wound care:    Comments:  As per discharge handout and nursing  instructions   11/22/18 1416          Diet: routine diet  Activity: Advance as tolerated. Pelvic rest for 6 weeks.   Outpatient follow up:1 week for BP and incision check Follow up Appt: Future Appointments  Date Time Provider Department Center  12/24/2018 11:15 AM Dorathy KinsmanSmith, [place], CNM [facility]   Follow up Visit:No follow-ups on file.  Postpartum contraception: Undecided  Newborn Data: Live born female  Birth Weight: 8 lb 6.2 oz (3805 g) APGAR: 8, 8  Newborn Delivery   Birth date/time:  11/20/2018 18:04:00 Delivery type:  C-Section, Low Transverse Trial of labor:  Yes C-section categorization:  Repeat     Baby Feeding: Bottle and Breast Disposition:home with mother   11/22/2018 Jaynie CollinsUgonna Tamantha Saline, MD

## 2018-11-22 NOTE — Discharge Instructions (Signed)
Cesarean Delivery, Care After This sheet gives you information about how to care for yourself after your procedure. Your health care provider may also give you more specific instructions. If you have problems or questions, contact your health care provider. What can I expect after the procedure? After the procedure, it is common to have:  A small amount of blood or clear fluid coming from the incision.  Some redness, swelling, and pain in your incision area.  Some abdominal pain and soreness.  Vaginal bleeding (lochia). Even though you did not have a vaginal delivery, you will still have vaginal bleeding and discharge.  Pelvic cramps.  Fatigue. You may have pain, swelling, and discomfort in the tissue between your vagina and your anus (perineum) if:  Your C-section was unplanned, and you were allowed to labor and push.  An incision was made in the area (episiotomy) or the tissue tore during attempted vaginal delivery. Follow these instructions at home: Incision care   Follow instructions from your health care provider about how to take care of your incision. Make sure you: ? Wash your hands with soap and water before you change your bandage (dressing). If soap and water are not available, use hand sanitizer. ? If you have a dressing, change it or remove it as told by your health care provider. ? Leave stitches (sutures), skin staples, skin glue, or adhesive strips in place. These skin closures may need to stay in place for 2 weeks or longer. If adhesive strip edges start to loosen and curl up, you may trim the loose edges. Do not remove adhesive strips completely unless your health care provider tells you to do that.  Check your incision area every day for signs of infection. Check for: ? More redness, swelling, or pain. ? More fluid or blood. ? Warmth. ? Pus or a bad smell.  Do not take baths, swim, or use a hot tub until your health care provider says it's okay. Ask your health  care provider if you can take showers.  When you cough or sneeze, hug a pillow. This helps with pain and decreases the chance of your incision opening up (dehiscing). Do this until your incision heals. Medicines  Take over-the-counter and prescription medicines only as told by your health care provider.  If you were prescribed an antibiotic medicine, take it as told by your health care provider. Do not stop taking the antibiotic even if you start to feel better.  Do not drive or use heavy machinery while taking prescription pain medicine. Lifestyle  Do not drink alcohol. This is especially important if you are breastfeeding or taking pain medicine.  Do not use any products that contain nicotine or tobacco, such as cigarettes, e-cigarettes, and chewing tobacco. If you need help quitting, ask your health care provider. Eating and drinking  Drink at least 8 eight-ounce glasses of water every day unless told not to by your health care provider. If you breastfeed, you may need to drink even more water.  Eat high-fiber foods every day. These foods may help prevent or relieve constipation. High-fiber foods include: ? Whole grain cereals and breads. ? Brown rice. ? Beans. ? Fresh fruits and vegetables. Activity   If possible, have someone help you care for your baby and help with household activities for at least a few days after you leave the hospital.  Return to your normal activities as told by your health care provider. Ask your health care provider what activities are safe for  you.  Rest as much as possible. Try to rest or take a nap while your baby is sleeping.  Do not lift anything that is heavier than 10 lbs (4.5 kg), or the limit that you were told, until your health care provider says that it is safe.  Talk with your health care provider about when you can engage in sexual activity. This may depend on your: ? Risk of infection. ? How fast you heal. ? Comfort and desire to  engage in sexual activity. General instructions  Do not use tampons or douches until your health care provider approves.  Wear loose, comfortable clothing and a supportive and well-fitting bra.  Keep your perineum clean and dry. Wipe from front to back when you use the toilet.  If you pass a blood clot, save it and call your health care provider to discuss. Do not flush blood clots down the toilet before you get instructions from your health care provider.  Keep all follow-up visits for you and your baby as told by your health care provider. This is important. Contact a health care provider if:  You have: ? A fever. ? Bad-smelling vaginal discharge. ? Pus or a bad smell coming from your incision. ? Difficulty or pain when urinating. ? A sudden increase or decrease in the frequency of your bowel movements. ? More redness, swelling, or pain around your incision. ? More fluid or blood coming from your incision. ? A rash. ? Nausea. ? Little or no interest in activities you used to enjoy. ? Questions about caring for yourself or your baby.  Your incision feels warm to the touch.  Your breasts turn red or become painful or hard.  You feel unusually sad or worried.  You vomit.  You pass a blood clot from your vagina.  You urinate more than usual.  You are dizzy or light-headed. Get help right away if:  You have: ? Pain that does not go away or get better with medicine. ? Chest pain. ? Difficulty breathing. ? Blurred vision or spots in your vision. ? Thoughts about hurting yourself or your baby. ? New pain in your abdomen or in one of your legs. ? A severe headache.  You faint.  You bleed from your vagina so much that you fill more than one sanitary pad in one hour. Bleeding should not be heavier than your heaviest period. Summary  After the procedure, it is common to have pain at your incision site, abdominal cramping, and slight bleeding from your vagina.  Check  your incision area every day for signs of infection.  Tell your health care provider about any unusual symptoms.  Keep all follow-up visits for you and your baby as told by your health care provider. This information is not intended to replace advice given to you by your health care provider. Make sure you discuss any questions you have with your health care provider. Document Released: 06/21/2002 Document Revised: 04/07/2018 Document Reviewed: 04/07/2018 Elsevier Interactive Patient Education  2019 Elsevier Inc.   Preeclampsia and Eclampsia  Preeclampsia is a serious condition that may develop during pregnancy. It is also called toxemia of pregnancy. This condition causes high blood pressure along with other symptoms, such as swelling and headaches. These symptoms may develop as the condition gets worse. Preeclampsia may occur at 20 weeks of pregnancy or later. Diagnosing and treating preeclampsia early is very important. If not treated early, it can cause serious problems for you and your baby. One problem  it can lead to is eclampsia. Eclampsia is a condition that causes muscle jerking or shaking (convulsions or seizures) and other serious problems for the mother. During pregnancy, delivering your baby may be the best treatment for preeclampsia or eclampsia. For most women, preeclampsia and eclampsia symptoms go away after giving birth. In rare cases, a woman may develop preeclampsia after giving birth (postpartum preeclampsia). This usually occurs within 48 hours after childbirth but may occur up to 6 weeks after giving birth. What are the causes? The cause of preeclampsia is not known. What increases the risk? The following risk factors make you more likely to develop preeclampsia:  Being pregnant for the first time.  Having had preeclampsia during a past pregnancy.  Having a family history of preeclampsia.  Having high blood pressure.  Being pregnant with more than one  baby.  Being 3735 or older.  Being African-American.  Having kidney disease or diabetes.  Having medical conditions such as lupus or blood diseases.  Being very overweight (obese). What are the signs or symptoms? The earliest signs of preeclampsia are:  High blood pressure.  Increased protein in your urine. Your health care provider will check for this at every visit before you give birth (prenatal visit). Other symptoms that may develop as the condition gets worse include:  Severe headaches.  Sudden weight gain.  Swelling of the hands, face, legs, and feet.  Nausea and vomiting.  Vision problems, such as blurred or double vision.  Numbness in the face, arms, legs, and feet.  Urinating less than usual.  Dizziness.  Slurred speech.  Abdominal pain, especially upper abdominal pain.  Convulsions or seizures. How is this diagnosed? There are no screening tests for preeclampsia. Your health care provider will ask you about symptoms and check for signs of preeclampsia during your prenatal visits. You may also have tests that include:  Urine tests.  Blood tests.  Checking your blood pressure.  Monitoring your babys heart rate.  Ultrasound. How is this treated? You and your health care provider will determine the treatment approach that is best for you. Treatment may include:  Having more frequent prenatal exams to check for signs of preeclampsia, if you have an increased risk for preeclampsia.  Medicine to lower your blood pressure.  Staying in the hospital, if your condition is severe. There, treatment will focus on controlling your blood pressure and the amount of fluids in your body (fluid retention).  Taking medicine (magnesium sulfate) to prevent seizures. This may be given as an injection or through an IV.  Taking a low-dose aspirin during your pregnancy.  Delivering your baby early, if your condition gets worse. You may have your labor started with  medicine (induced), or you may have a cesarean delivery. Follow these instructions at home: Eating and drinking   Drink enough fluid to keep your urine pale yellow.  Avoid caffeine. Lifestyle  Do not use any products that contain nicotine or tobacco, such as cigarettes and e-cigarettes. If you need help quitting, ask your health care provider.  Do not use alcohol or drugs.  Avoid stress as much as possible. Rest and get plenty of sleep. General instructions  Take over-the-counter and prescription medicines only as told by your health care provider.  When lying down, lie on your left side. This keeps pressure off your major blood vessels.  When sitting or lying down, raise (elevate) your feet. Try putting some pillows underneath your lower legs.  Exercise regularly. Ask your health care provider  what kinds of exercise are best for you.  Keep all follow-up and prenatal visits as told by your health care provider. This is important. How is this prevented? There is no known way of preventing preeclampsia or eclampsia from developing. However, to lower your risk of complications and detect problems early:  Get regular prenatal care. Your health care provider may be able to diagnose and treat the condition early.  Maintain a healthy weight. Ask your health care provider for help managing weight gain during pregnancy.  Work with your health care provider to manage any long-term (chronic) health conditions you have, such as diabetes or kidney problems.  You may have tests of your blood pressure and kidney function after giving birth.  Your health care provider may have you take low-dose aspirin during your next pregnancy. Contact a health care provider if:  You have symptoms that your health care provider told you may require more treatment or monitoring, such as: ? Headaches. ? Nausea or vomiting. ? Abdominal pain. ? Dizziness. ? Light-headedness. Get help right away if:  You  have severe: ? Abdominal pain. ? Headaches that do not get better. ? Dizziness. ? Vision problems. ? Confusion. ? Nausea or vomiting.  You have any of the following: ? A seizure. ? Sudden, rapid weight gain. ? Sudden swelling in your hands, ankles, or face. ? Trouble moving any part of your body. ? Numbness in any part of your body. ? Trouble speaking. ? Abnormal bleeding.  You faint. Summary  Preeclampsia is a serious condition that may develop during pregnancy. It is also called toxemia of pregnancy.  This condition causes high blood pressure along with other symptoms, such as swelling and headaches.  Diagnosing and treating preeclampsia early is very important. If not treated early, it can cause serious problems for you and your baby.  Get help right away if you have symptoms that your health care provider told you to watch for. This information is not intended to replace advice given to you by your health care provider. Make sure you discuss any questions you have with your health care provider. Document Released: 09/26/2000 Document Revised: 09/15/2017 Document Reviewed: 05/05/2016 Elsevier Interactive Patient Education  2019 Elsevier Inc.   Postpartum Hypertension Postpartum hypertension is high blood pressure that remains higher than normal after childbirth. You may not realize that you have postpartum hypertension if your blood pressure is not being checked regularly. In most cases, postpartum hypertension will go away on its own, usually within a week of delivery. However, for some women, medical treatment is required to prevent serious complications, such as seizures or stroke. What are the causes? This condition may be caused by one or more of the following:  Hypertension that existed before pregnancy (chronic hypertension).  Hypertension that comes on as a result of pregnancy (gestational hypertension).  Hypertensive disorders during pregnancy (preeclampsia) or  seizures in women who have high blood pressure during pregnancy (eclampsia).  A condition in which the liver, platelets, and red blood cells are damaged during pregnancy (HELLP syndrome).  A condition in which the thyroid produces too much hormones (hyperthyroidism).  Other rare problems of the nerves (neurological disorders) or blood disorders. In some cases, the cause may not be known. What increases the risk? The following factors may make you more likely to develop this condition:  Chronic hypertension. In some cases, this may not have been diagnosed before pregnancy.  Obesity.  Type 2 diabetes.  Kidney disease.  History of preeclampsia or  eclampsia.  Other medical conditions that change the level of hormones in the body (hormonal imbalance). What are the signs or symptoms? As with all types of hypertension, postpartum hypertension may not have any symptoms. Depending on how high your blood pressure is, you may experience:  Headaches. These may be mild, moderate, or severe. They may also be steady, constant, or sudden in onset (thunderclap headache).  Changes in your ability to see (visual changes).  Dizziness.  Shortness of breath.  Swelling of your hands, feet, lower legs, or face. In some cases, you may have swelling in more than one of these locations.  Heart palpitations or a racing heartbeat.  Difficulty breathing while lying down.  Decrease in the amount of urine that you pass. Other rare signs and symptoms may include:  Sweating more than usual. This lasts longer than a few days after delivery.  Chest pain.  Sudden dizziness when you get up from sitting or lying down.  Seizures.  Nausea or vomiting.  Abdominal pain. How is this diagnosed? This condition may be diagnosed based on the results of a physical exam, blood pressure measurements, and blood and urine tests. You may also have other tests, such as a CT scan or an MRI, to check for other  problems of postpartum hypertension. How is this treated? If blood pressure is high enough to require treatment, your options may include:  Medicines to reduce blood pressure (antihypertensives). Tell your health care provider if you are breastfeeding or if you plan to breastfeed. There are many antihypertensive medicines that are safe to take while breastfeeding.  Stopping medicines that may be causing hypertension.  Treating medical conditions that are causing hypertension.  Treating the complications of hypertension, such as seizures, stroke, or kidney problems. Your health care provider will also continue to monitor your blood pressure closely until it is within a safe range for you. Follow these instructions at home:  Take over-the-counter and prescription medicines only as told by your health care provider.  Return to your normal activities as told by your health care provider. Ask your health care provider what activities are safe for you.  Do not use any products that contain nicotine or tobacco, such as cigarettes and e-cigarettes. If you need help quitting, ask your health care provider.  Keep all follow-up visits as told by your health care provider. This is important. Contact a health care provider if:  Your symptoms get worse.  You have new symptoms, such as: ? A headache that does not get better. ? Dizziness. ? Visual changes. Get help right away if:  You suddenly develop swelling in your hands, ankles, or face.  You have sudden, rapid weight gain.  You develop difficulty breathing, chest pain, racing heartbeat, or heart palpitations.  You develop severe pain in your abdomen.  You have any symptoms of a stroke. "BE FAST" is an easy way to remember the main warning signs of a stroke: ? B - Balance. Signs are dizziness, sudden trouble walking, or loss of balance. ? E - Eyes. Signs are trouble seeing or a sudden change in vision. ? F - Face. Signs are sudden  weakness or numbness of the face, or the face or eyelid drooping on one side. ? A - Arms. Signs are weakness or numbness in an arm. This happens suddenly and usually on one side of the body. ? S - Speech. Signs are sudden trouble speaking, slurred speech, or trouble understanding what people say. ? T - Time.  Time to call emergency services. Write down what time symptoms started.  You have other signs of a stroke, such as: ? A sudden, severe headache with no known cause. ? Nausea or vomiting. ? Seizure. These symptoms may represent a serious problem that is an emergency. Do not wait to see if the symptoms will go away. Get medical help right away. Call your local emergency services (911 in the [place] Do not drive yourself to the hospital. Summary  Postpartum hypertension is high blood pressure that remains higher than normal after childbirth.  In most cases, postpartum hypertension will go away on its own, usually within a week of delivery.  For some women, medical treatment is required to prevent serious complications, such as seizures or stroke. This information is not intended to replace advice given to you by your health care provider. Make sure you discuss any questions you have with your health care provider. Document Released: 06/02/2014 Document Revised: 07/20/2017 Document Reviewed: 07/20/2017 Elsevier Interactive Patient Education  2019 [employer]

## 2018-11-22 NOTE — Lactation Note (Signed)
This note was copied from a baby's chart. Lactation Consultation Note;  Introduced myself through the [employer], ID number [phone].,  Mother reports that she doesn't have milk.  Mother taught hand expression and observed large flow of ebm.  Mother breastfeeding infant in cradle hold.  Infant latched poorly with chin away from mothers breast and infant on her back.   Assist mother with cross cradle hold. Infant latched with wide gape.  Observed better depth.  Observed good burst of rhythmic suckling and swallows for 15 mins.   Mother taught to use good pillow support and place infant tummy to tummy . Infant placed in football hold . Infant latched well with wide open mouth.  Infant sustained latch for 15-20 mins.  Encouraged mother to hand express ebm  Mother was given a harmony hand pump with a #27 flange.   Discussed importance of using DEBP after breastfeeding.  Encouraged to post pump and use her own milk to supplement infant with .  Mother very pleasant and receptive to all teaching and reports that she clearly understands.   Mother reminded of LC services . [place] [facility] [place]   Patient Name: [name] EAVWU'JToday's Date: 11/22/2018 Reason for consult: Follow-up assessment   Maternal Data    Feeding Feeding Type: Breast Fed  LATCH Score Latch: Grasps breast easily, tongue down, lips flanged, rhythmical sucking.  Audible Swallowing: Spontaneous and intermittent  Type of Nipple: Everted at rest and after stimulation  Comfort (Breast/Nipple): Soft / non-tender  Hold (Positioning): Assistance needed to correctly position infant at breast and maintain latch.  LATCH Score: 9  Interventions Interventions: Hand express;Breast compression;Adjust position;Support pillows;Position options;Hand pump;DEBP(mother fit with a #27 flange)  Lactation Tools Discussed/Used     Consult Status Consult Status: Follow-up Date:  11/23/18 Follow-up type: In-patient    Stevan BornKendrick, Staphany Ditton [facility] 11/22/2018, 12:12 PM

## 2018-11-25 ENCOUNTER — Telehealth: Payer: Self-pay | Admitting: *Deleted

## 2018-11-25 NOTE — Telephone Encounter (Signed)
Received a voicemail from a Sherrie with [employer] stating she is calling because she needs to get patient's delivery date/ type of delivery  And hospital dates. Request a call back to [phone]

## 2018-11-29 ENCOUNTER — Ambulatory Visit (INDEPENDENT_AMBULATORY_CARE_PROVIDER_SITE_OTHER): Payer: Medicaid Other | Admitting: General Practice

## 2018-11-29 VITALS — BP 124/85 | HR 67 | Wt 116.0 lb

## 2018-11-29 DIAGNOSIS — Z5189 Encounter for other specified aftercare: Secondary | ICD-10-CM

## 2018-11-29 DIAGNOSIS — Z0130 Encounter for examination of blood pressure without abnormal findings: Secondary | ICD-10-CM

## 2018-11-29 NOTE — Progress Notes (Signed)
Patient presents to office today for blood pressure check & wound check following repeat c-section on 2/8. Patient reports taking BP medication daily and denies headaches, dizziness or blurry vision. BP is WNL today. Honeycomb dressing was still on incision- dressing removed. Incision is clean, dry & intact. Reviewed wound care and signs & symptoms of infection. Patient will follow up at postpartum visit on 3/13.   Chase Caller RN BSN 11/29/18

## 2018-11-30 ENCOUNTER — Encounter: Payer: Self-pay | Admitting: *Deleted

## 2018-11-30 ENCOUNTER — Telehealth: Payer: Self-pay | Admitting: *Deleted

## 2018-11-30 NOTE — Telephone Encounter (Signed)
Raynelle Fanning from [facility] left a voice message this pm stating she did a postpartum visit at patient's home today. States patient delivered 11/20/18 . She wanted to report a mildly elevated Blood pressure of 128/94. States patient is on norvasc 5mg  and is taking her medicine. Also states Mom and baby are doing well.   Per chart review had nurse visit for Incision and blood pressure in our office yesterday 11/29/18 , bp wnl. Has postpartum visit scheduled 12/24/18.  Will route to provider.

## 2018-12-03 NOTE — Progress Notes (Signed)
I have reviewed this chart and agree with the RN/CMA assessment and management.    Jeffifer Rabold C Adir Schicker, MD, FACOG Attending Physician, [facility]  

## 2018-12-07 ENCOUNTER — Ambulatory Visit: Payer: BLUE CROSS/BLUE SHIELD

## 2018-12-24 ENCOUNTER — Ambulatory Visit: Payer: BLUE CROSS/BLUE SHIELD | Admitting: Obstetrics and Gynecology

## 2018-12-24 ENCOUNTER — Ambulatory Visit: Payer: BLUE CROSS/BLUE SHIELD | Admitting: Advanced Practice Midwife

## 2019-01-03 ENCOUNTER — Encounter: Payer: Self-pay | Admitting: *Deleted

## 2019-01-10 ENCOUNTER — Other Ambulatory Visit: Payer: Self-pay

## 2019-01-10 ENCOUNTER — Encounter: Payer: Self-pay | Admitting: Obstetrics and Gynecology

## 2019-01-10 ENCOUNTER — Ambulatory Visit (INDEPENDENT_AMBULATORY_CARE_PROVIDER_SITE_OTHER): Payer: PRIVATE HEALTH INSURANCE | Admitting: Obstetrics and Gynecology

## 2019-01-10 DIAGNOSIS — Z789 Other specified health status: Secondary | ICD-10-CM

## 2019-01-10 DIAGNOSIS — O1410 Severe pre-eclampsia, unspecified trimester: Secondary | ICD-10-CM

## 2019-01-10 DIAGNOSIS — Z1389 Encounter for screening for other disorder: Secondary | ICD-10-CM | POA: Diagnosis not present

## 2019-01-10 DIAGNOSIS — Z9889 Other specified postprocedural states: Secondary | ICD-10-CM

## 2019-01-10 DIAGNOSIS — Z603 Acculturation difficulty: Secondary | ICD-10-CM

## 2019-01-10 DIAGNOSIS — Z3009 Encounter for other general counseling and advice on contraception: Secondary | ICD-10-CM

## 2019-01-10 NOTE — Progress Notes (Signed)
TELEHEALTH VIRTUAL OBSTETRICS VISIT ENCOUNTER NOTE  I connected with Sequoyah Piester on 01/10/19 at  1:15 PM EDT by telephone at home and verified that I am speaking with the correct person using two identifiers.   I discussed the limitations, risks, security and privacy concerns of performing an evaluation and management service by telephone and the availability of in person appointments. I also discussed with the patient that there may be a patient responsible charge related to this service. The patient expressed understanding and agreed to proceed.  Appointment Date: 01/10/2019  OBGYN Clinic: [facility]  Primary Care Provider: Leland Her  Chief Complaint: No chief complaint on file.   History of Present Illness: Arlis Fitzmaurice is a 23 y.o. African [id number] (Patient's last menstrual period was 02/28/2018.), seen for the above chief complaint. Her past medical history is significant for h/o C-section.   She is s/p RCS on 11/20/18 at [redacted]w[redacted]d; she was discharged to home on POD#2. Pregnancy complicated by arrest of dilation, preeclampsia with severe features, sent home on norvasc 5 mg daily.  Complains of occasional incisional pain. Denies that it looks infected/red/oozing. Is taking norvasc 5 mg daily, denies headaches/other issues.  Vaginal bleeding or discharge: No  Breast or formula feeding: both Intercourse: No  Contraception: depo -> IUD PP depression s/s: No  Any bowel or bladder issues: yes, has some constipation Pap smear: no abnormalities (date: 04/2018)  Review of Systems: Positive for n/a.  Her 12 point review of systems is negative or as noted in the History of Present Illness.  Patient Active Problem List   Diagnosis Date Noted  . Severe preeclampsia, delivered 11/22/2018  . Post-dates pregnancy 11/20/2018  . History of C-section 05/10/2018  . S/P cesarean section 05/10/2018  . Eye lesion 01/07/2017  . Language barrier 08/22/2015    Medications Tasheema Anglada had no  medications administered during this visit. Current Outpatient Medications  Medication Sig Dispense Refill  . amLODipine (NORVASC) 5 MG tablet Take 1 tablet (5 mg total) by mouth daily. 30 tablet 2  . docusate sodium (COLACE) 100 MG capsule Take 1 capsule (100 mg total) by mouth 2 (two) times daily as needed for mild constipation or moderate constipation. 30 capsule 2  . ibuprofen (ADVIL,MOTRIN) 600 MG tablet Take 1 tablet (600 mg total) by mouth every 6 (six) hours as needed for fever, headache or moderate pain. 30 tablet 2  . oxyCODONE (OXY IR/ROXICODONE) 5 MG immediate release tablet Take 1 tablet (5 mg total) by mouth every 4 (four) hours as needed for severe pain or breakthrough pain. 30 tablet 0   No current facility-administered medications for this visit.     Allergies Patient has no known allergies.  Physical Exam:  General:  Alert, oriented and cooperative.   Mental Status: Normal mood and affect perceived. Normal judgment and thought content.  Rest of physical exam deferred due to type of encounter  PP Depression Screening:   Edinburgh Postnatal Depression Scale - 01/10/19 1335      Edinburgh Postnatal Depression Scale:  In the Past 7 Days   I have been able to laugh and see the funny side of things.  0    I have looked forward with enjoyment to things.  0    I have blamed myself unnecessarily when things went wrong.  0    I have been anxious or worried for no good reason.  0    I have felt scared or panicky for no good reason.  0  Things have been getting on top of me.  0    I have been so unhappy that I have had difficulty sleeping.  0    I have felt sad or miserable.  0    I have been so unhappy that I have been crying.  0    The thought of harming myself has occurred to me.  0    Edinburgh Postnatal Depression Scale Total  0        Assessment: Patient is a 23 y.o. [id number] who is 6 weeks post partum from a repeat c-section, c/b severe preeclampsia. She is doing  well, taking norvasc 5 mg regularly, no major issues.  Plan:   1. Postoperative state Doing well, no complaints aside from occasional pain  2. Severe pre-eclampsia, antepartum Cont norvasc BP check in car due to COVID  3. Postpartum state Doing well  4. Language barrier Investment banker, corporate used  5. Encounter for other general counseling or advice on contraception Reviewed options for contraception, reviewed risks/benefits of options. She would like IUD, reviewed may be difficult to get her into office for a while secondary to COVID-19, she will get depo for now and get IUD in several months.    I provided 16 minutes of non-face-to-face time during this encounter.  Baldemar Lenis, M.D. Attending [facility] [employer] [facility])

## 2019-01-11 ENCOUNTER — Ambulatory Visit (INDEPENDENT_AMBULATORY_CARE_PROVIDER_SITE_OTHER): Payer: BLUE CROSS/BLUE SHIELD

## 2019-01-11 ENCOUNTER — Other Ambulatory Visit: Payer: Self-pay

## 2019-01-11 VITALS — BP 128/86

## 2019-01-11 DIAGNOSIS — Z3042 Encounter for surveillance of injectable contraceptive: Secondary | ICD-10-CM | POA: Diagnosis not present

## 2019-01-11 MED ORDER — MEDROXYPROGESTERONE ACETATE 150 MG/ML IM SUSP
150.0000 mg | Freq: Once | INTRAMUSCULAR | Status: AC
  Administered 2019-01-11: 150 mg via INTRAMUSCULAR

## 2019-01-11 NOTE — Progress Notes (Signed)
Sally Hampton here for Depo-Provera  Injection.  Injection administered without complication. Patient will return in 3 months for next injection.   Ralene Bathe, RN 01/11/2019  2:41 PM

## 2019-01-11 NOTE — Progress Notes (Signed)
Chart reviewed for nurse visit. Agree with plan of care.   Judeth Horn, NP 01/11/2019 2:48 PM

## 2019-03-29 ENCOUNTER — Other Ambulatory Visit: Payer: Self-pay

## 2019-03-29 ENCOUNTER — Telehealth: Payer: Self-pay | Admitting: Family Medicine

## 2019-03-29 ENCOUNTER — Ambulatory Visit (INDEPENDENT_AMBULATORY_CARE_PROVIDER_SITE_OTHER): Payer: Medicaid Other

## 2019-03-29 DIAGNOSIS — Z3042 Encounter for surveillance of injectable contraceptive: Secondary | ICD-10-CM

## 2019-03-29 MED ORDER — MEDROXYPROGESTERONE ACETATE 150 MG/ML IM SUSP
150.0000 mg | Freq: Once | INTRAMUSCULAR | Status: AC
  Administered 2019-06-14: 150 mg via INTRAMUSCULAR

## 2019-03-29 NOTE — Telephone Encounter (Signed)
Spoke with patient with Swahili interpreter ID# [id number] about her appointment on 6/16 @ 2:00. Patient was instructed to wear a face mask and no visitors are allowed with her. Patient was screened for covid symptoms and denied having any.

## 2019-03-29 NOTE — Progress Notes (Signed)
Chart reviewed for nurse visit. Agree with plan of care.   Laurian Edrington M, CNM 03/29/2019 2:42 PM   

## 2019-03-29 NOTE — Progress Notes (Signed)
Sally Hampton here for Depo-Provera  Injection.  Injection administered without complication. Patient will return in 3 months for next injection.  Verdell Carmine, RN 03/29/2019  2:16 PM

## 2019-06-13 ENCOUNTER — Telehealth: Payer: Self-pay | Admitting: Family Medicine

## 2019-06-13 NOTE — Telephone Encounter (Signed)
Spoke with patient w/ Swahili interpreter ID# [phone] about her appointment on 9/1 @ 8:30. Patient instructed to wear a face mask for the entire appointment and no visitors are allowed. Patient screened for covid symptoms and denied having any.

## 2019-06-14 ENCOUNTER — Ambulatory Visit (INDEPENDENT_AMBULATORY_CARE_PROVIDER_SITE_OTHER): Payer: Medicaid Other | Admitting: General Practice

## 2019-06-14 ENCOUNTER — Other Ambulatory Visit: Payer: Self-pay

## 2019-06-14 VITALS — BP 157/95 | HR 62 | Ht <= 58 in | Wt 130.0 lb

## 2019-06-14 DIAGNOSIS — Z3042 Encounter for surveillance of injectable contraceptive: Secondary | ICD-10-CM | POA: Diagnosis not present

## 2019-06-14 NOTE — Progress Notes (Signed)
Audree Camel here for Depo-Provera  Injection.  Injection administered without complication. Patient will return in 3 months for next injection. Advised patient she follow up with PCP due to elevated BP today. She verbalizes understanding.  Derinda Late, RN 06/14/2019  8:32 AM

## 2019-06-16 NOTE — Progress Notes (Signed)
Patient seen and assessed by nursing staff during this encounter. I have reviewed the chart and agree with the documentation and plan.  Jesaiah Fabiano, MD 06/16/2019 1:18 PM    

## 2019-06-22 ENCOUNTER — Telehealth: Payer: Self-pay | Admitting: Family Medicine

## 2019-06-22 NOTE — Telephone Encounter (Signed)
Attempted to call patient with Swahili interpreter about her paperwork that she dropped at the office. Unable to contact the patient due to the number being invalid.

## 2019-08-31 ENCOUNTER — Ambulatory Visit (INDEPENDENT_AMBULATORY_CARE_PROVIDER_SITE_OTHER): Payer: Medicaid Other

## 2019-08-31 ENCOUNTER — Other Ambulatory Visit: Payer: Self-pay

## 2019-08-31 ENCOUNTER — Ambulatory Visit: Payer: Medicaid Other

## 2019-08-31 VITALS — BP 150/107 | Wt 129.1 lb

## 2019-08-31 DIAGNOSIS — Z3042 Encounter for surveillance of injectable contraceptive: Secondary | ICD-10-CM

## 2019-08-31 MED ORDER — MEDROXYPROGESTERONE ACETATE 150 MG/ML IM SUSP
150.0000 mg | Freq: Once | INTRAMUSCULAR | Status: AC
  Administered 2019-08-31: 10:00:00 150 mg via INTRAMUSCULAR

## 2019-08-31 NOTE — Progress Notes (Signed)
Sally Hampton here for Depo-Provera  Injection. BP today 150/107. Pt denies blurry vision, dizziness, or headache; states she does experience headache at times but none today. Reviewed with Ilda Basset, MD who recommends pt receive Depo-Provera today and start care with PCP for management of BP. Injection administered without complication. Patient will return in 3 months for next injection. Information given to pt about PCP. Pt verbalizes understanding of need to make new patient appt.  Annabell Howells, RN 08/31/2019  9:45 AM

## 2019-09-01 NOTE — Progress Notes (Signed)
Patient seen and assessed by nursing staff during this encounter. I have reviewed the chart and agree with the documentation and plan.  Aletha Halim, MD 09/01/2019 12:55 PM

## 2019-11-16 ENCOUNTER — Ambulatory Visit: Payer: Medicaid Other

## 2019-11-29 ENCOUNTER — Ambulatory Visit (INDEPENDENT_AMBULATORY_CARE_PROVIDER_SITE_OTHER): Payer: Medicaid Other

## 2019-11-29 ENCOUNTER — Other Ambulatory Visit: Payer: Self-pay

## 2019-11-29 VITALS — Wt 124.5 lb

## 2019-11-29 DIAGNOSIS — Z3042 Encounter for surveillance of injectable contraceptive: Secondary | ICD-10-CM

## 2019-11-29 MED ORDER — MEDROXYPROGESTERONE ACETATE 150 MG/ML IM SUSP
150.0000 mg | Freq: Once | INTRAMUSCULAR | Status: AC
  Administered 2019-11-29: 150 mg via INTRAMUSCULAR

## 2019-11-29 NOTE — Progress Notes (Signed)
Sally Hampton here for Depo-Provera  Injection.  Injection administered without complication. Patient will return in 3 months for next injection.  Ernestina Patches, CMA 11/29/2019  2:41 PM

## 2019-12-26 IMAGING — US US MFM OB DETAIL+14 WK
1 series · 13 of 28 positions shown · non-contrast
Comparison: none

[Series 1: us mfm ob detail+14 wk · 13 of 83 slices shown]
[im 4/83]
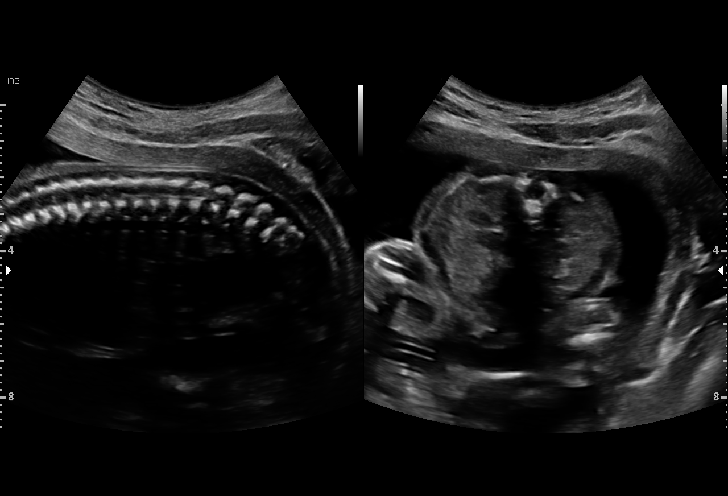
[im 10/83]
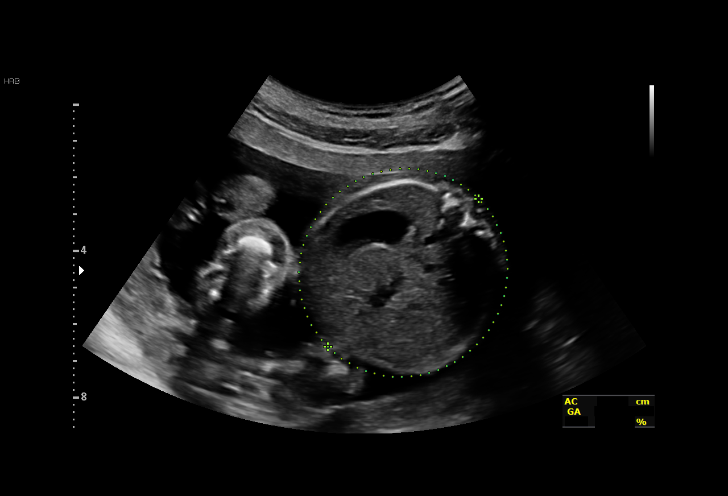
[im 16/83]
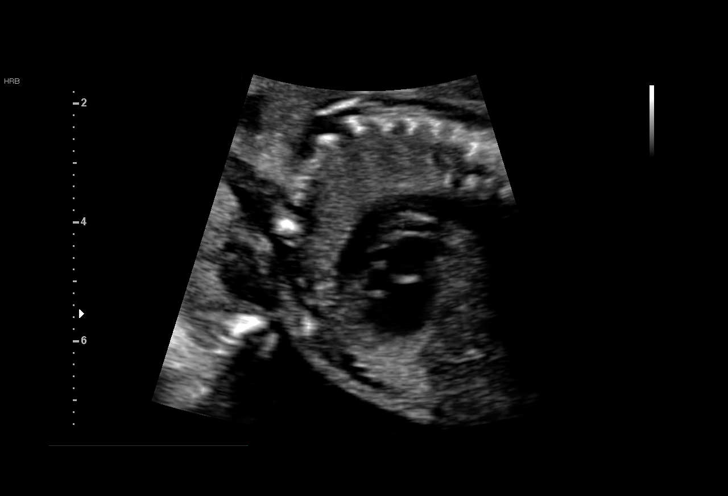
[im 22/83]
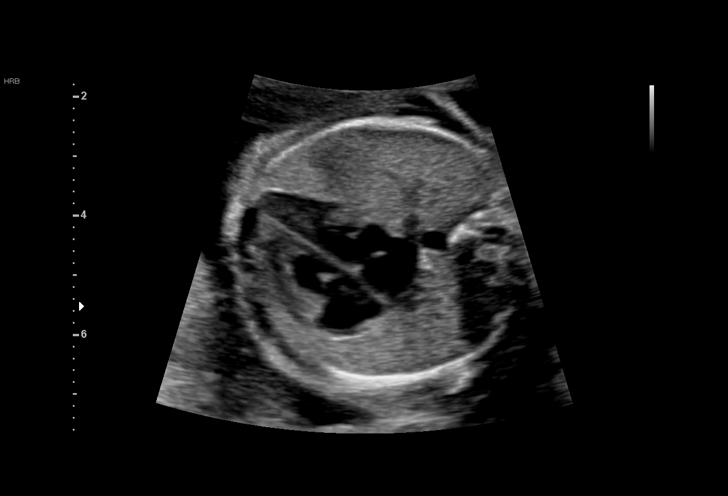
[im 28/83]
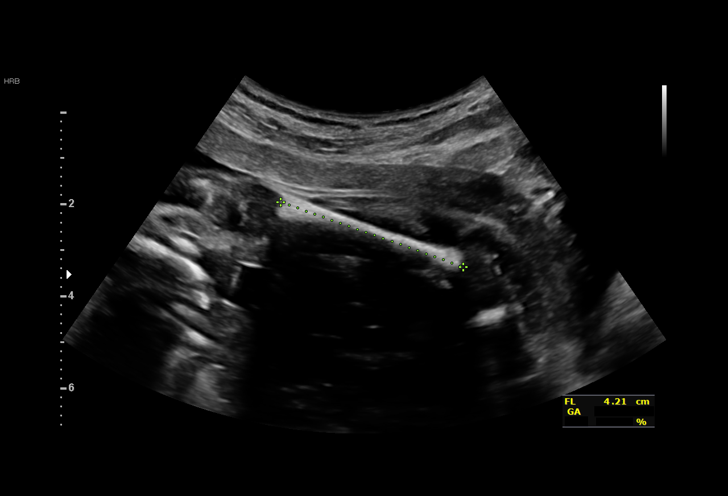
[im 34/83]
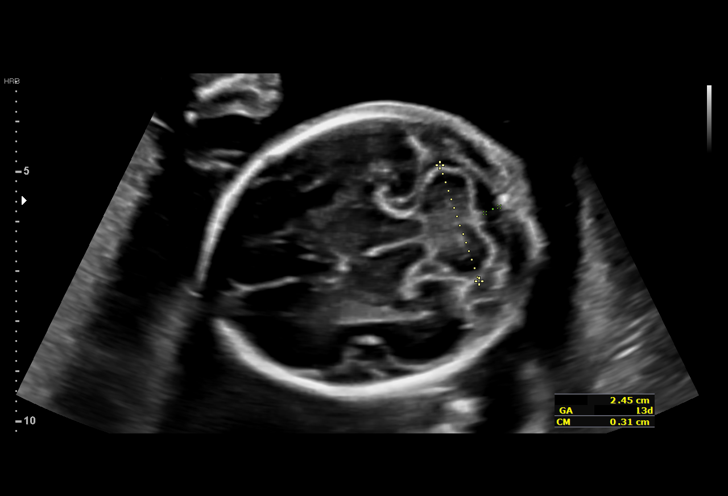
[im 43/83]
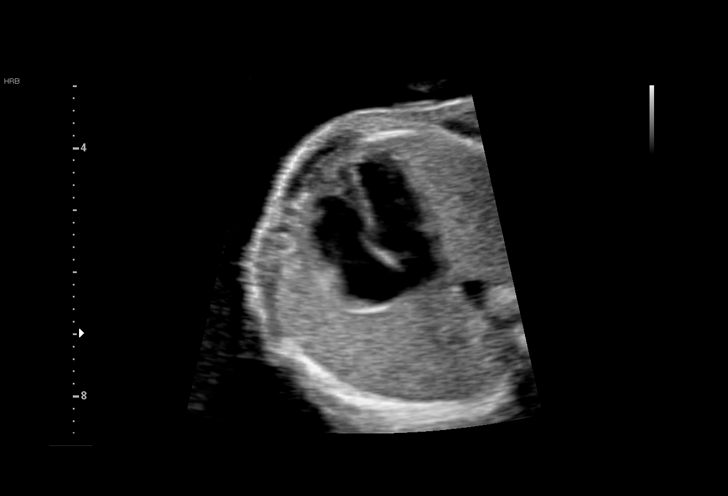
[im 49/83]
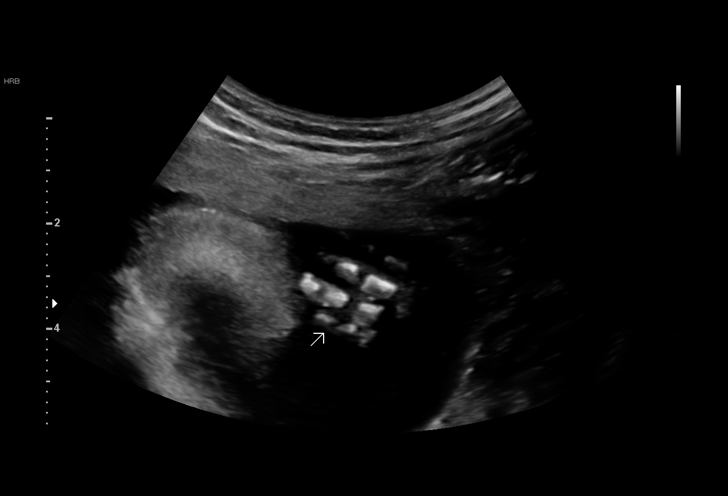
[im 55/83]
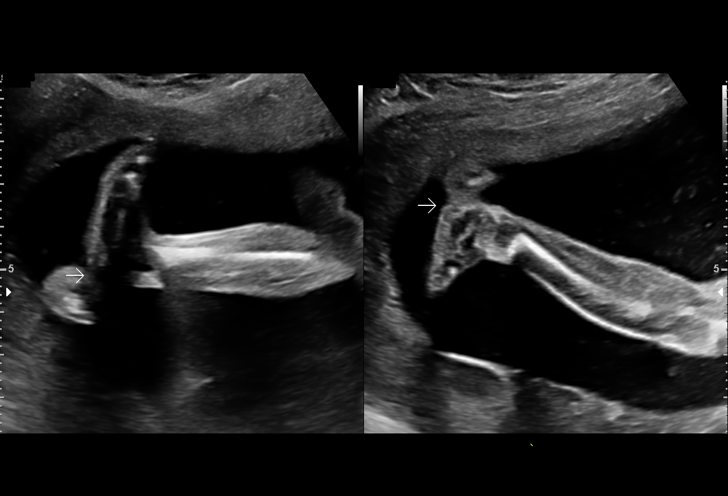
[im 61/83]
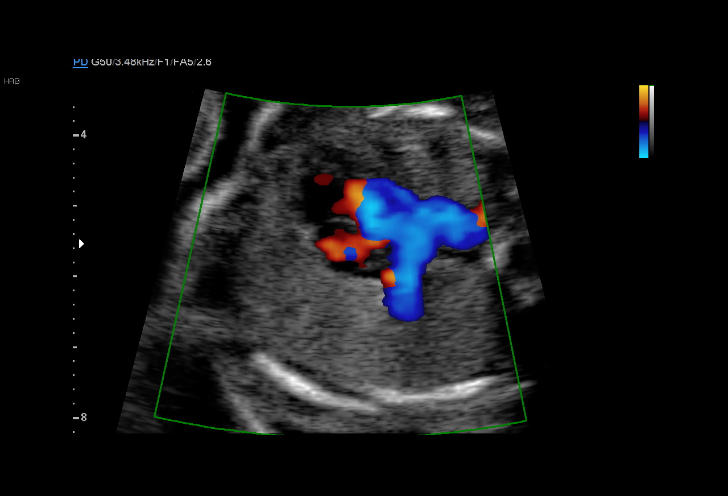
[im 67/83]
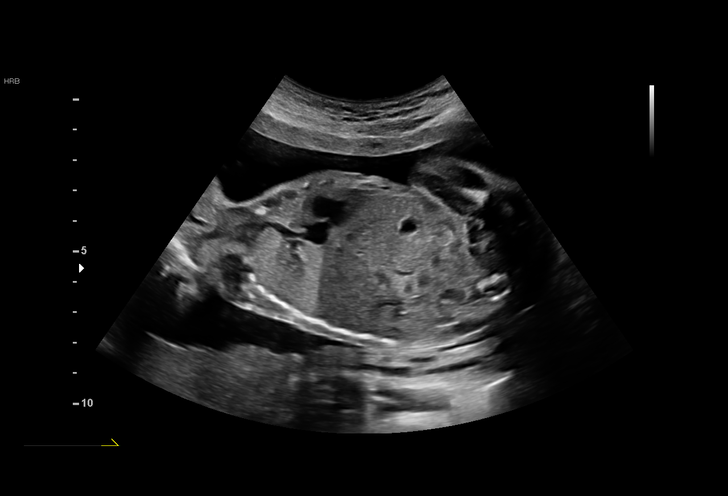
[im 73/83]
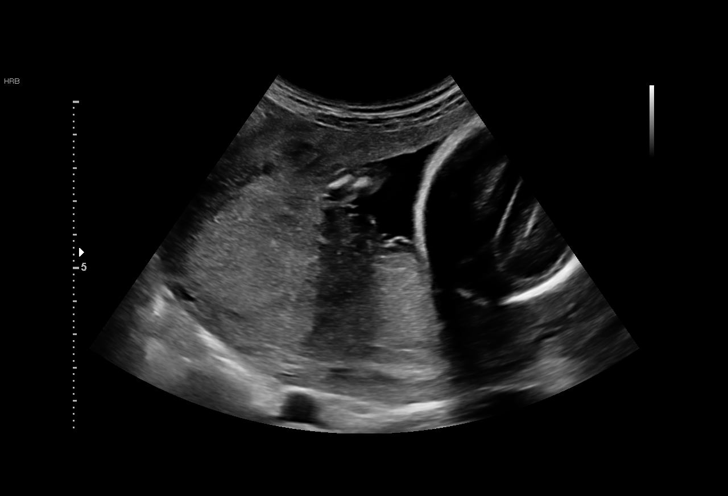
[im 79/83]
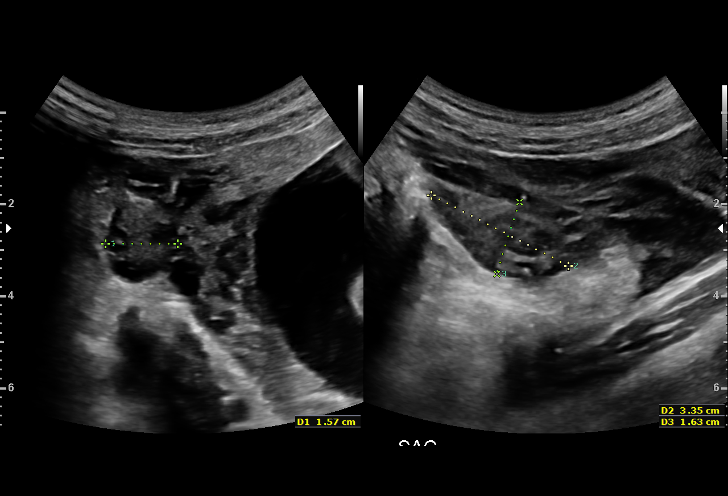

[13 of 28 positions shown; findings below may reference images not displayed]

Indications

Echogenic intracardiac focus of the heart
(EIF)
Encounter for antenatal screening for
malformations
Previous cesarean delivery, antepartum x2
22 weeks gestation of pregnancy
Vital Signs

BMI:
Fetal Evaluation

Num Of Fetuses:         1
Cardiac Activity:       Observed
Presentation:           Breech
Placenta:               Posterior
P. Cord Insertion:      Visualized, central

Amniotic Fluid
AFI FV:      Within normal limits

Largest Pocket(cm)
4.3
Biometry

BPD:      54.8  mm     G. Age:  22w 5d         45  %    CI:         77.4   %    70 - 86
FL/HC:      21.3   %    19.2 -
HC:      197.2  mm     G. Age:  21w 6d         11  %    HC/AC:      1.10        1.05 -
AC:      178.8  mm     G. Age:  22w 5d         43  %    FL/BPD:     76.8   %    71 - 87
FL:       42.1  mm     G. Age:  23w 5d         72  %    FL/AC:      23.5   %    20 - 24
HUM:      38.6  mm     G. Age:  23w 5d         66  %
CER:      24.5  mm     G. Age:  22w 4d         47  %
LV:        3.5  mm
CM:        3.1  mm

Est. FW:     557  gm      1 lb 4 oz     57  %
OB History

Gravidity:    4         Term:   2         SAB:   1
Living:       2
Gestational Age

LMP:           19w 5d        Date:  02/28/18                 EDD:   12/05/18
U/S Today:     22w 5d                                        EDD:   11/14/18
Best:          22w 5d     Det. By:  U/S (07/16/18)           EDD:   11/14/18
Anatomy

Cranium:               Appears normal         Aortic Arch:            Appears normal
Cavum:                 Appears normal         Ductal Arch:            Appears normal
Ventricles:            Appears normal         Diaphragm:              Appears normal
Choroid Plexus:        Appears normal         Stomach:                Appears normal, left
sided
Cerebellum:            Appears normal         Abdomen:                Appears normal
Posterior Fossa:       Appears normal         Abdominal Wall:         Appears nml (cord
insert, abd wall)
Nuchal Fold:           Not applicable (>20    Cord Vessels:           Appears normal (3
wks GA)                                        vessel cord)
Face:                  Appears normal         Kidneys:                Appear normal
(orbits and profile)
Lips:                  Appears normal         Bladder:                Appears normal
Thoracic:              Appears normal         Spine:                  Appears normal
Heart:                 Echogenic focus        Upper Extremities:      Appears normal
in LV
RVOT:                  Appears normal         Lower Extremities:      Appears normal
LVOT:                  Appears normal

Other:  Female gender Heels and 5th digit visualized. Nasal bone visualized.
Open hands visualized.
Cervix Uterus Adnexa

Cervix
Length:            3.6  cm.
Normal appearance by transabdominal scan.

Uterus
No abnormality visualized.

Left Ovary
No adnexal mass visualized.

Right Ovary
Size(cm)     3.35   x   1.63   x  1.57      Vol(ml):
Within normal limits.

Cul De Sac
No free fluid seen.
Adnexa
No abnormality visualized.
Impression

We performed fetal anatomy scan. An echogenic intracardiac
focus is seen. No other makers of aneuploidies or fetal
structural defects are seen. Fetal biometry is consistent 22w
5d. Amniotic fluid is normal and good fetal activity is seen.
We have assigned the EDD at 11/14/2018.

On cell-free fetal DNA screening, the risks of fetal
aneuploidies are not increased. MSAFP screening showed
low risk for open-neural tube defects.

I informed the patient that given that she had [place] for fetal
aneuploidies on cell-free fetal DNA screening, finding of
echogenic intracardiac focus should be considered a normal
variant and that the risk of trisomy 21 is not increased. I also
reassured that echogenic focus does not increase the risk of
cardiac defects.

We requested the lab to recalculate spina bifida risk. Please
follow-up on the recalculated risks.
Recommendations

An appointment was made for her to return in 4 weeks for
fetal growth assessment.

## 2020-01-23 IMAGING — US US MFM OB FOLLOW-UP
1 series · 14 of 28 positions shown · non-contrast
Comparison: none

[Series 1: us mfm ob follow-up · 49 acquisitions, 14 frames shown]
[im 2/49]
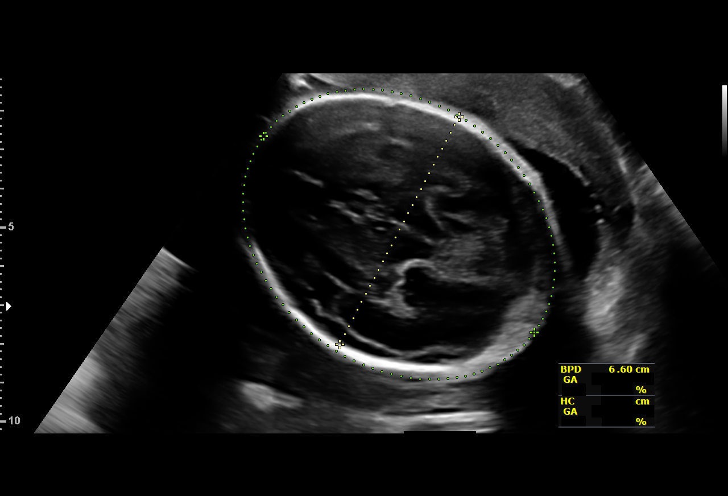
[im 6/49]
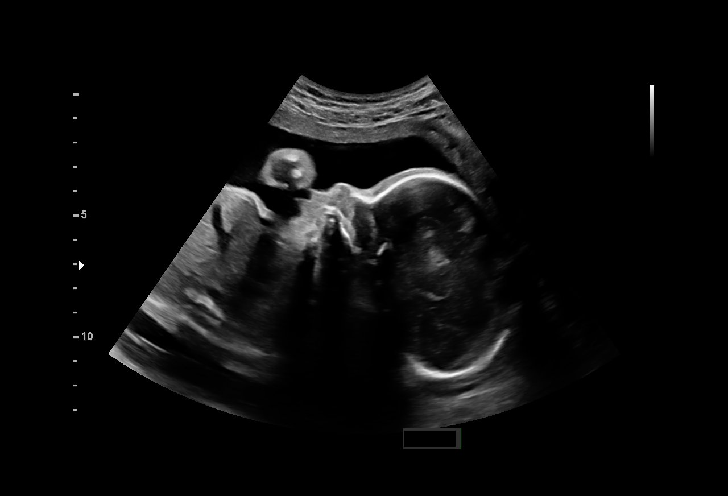
[im 9/49]
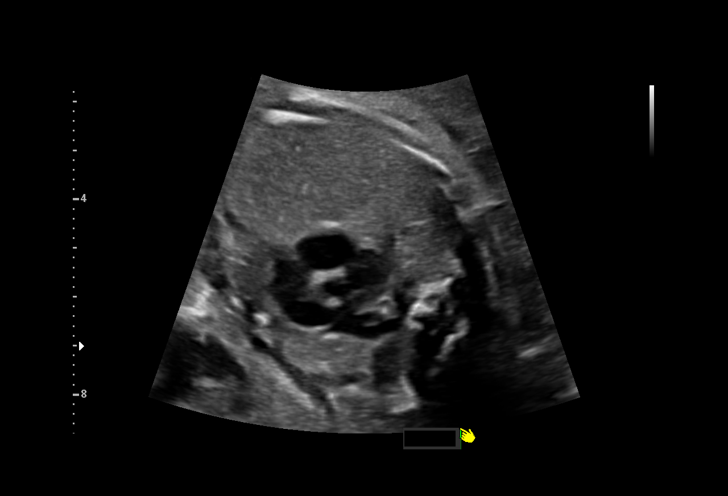
[im 13/49]
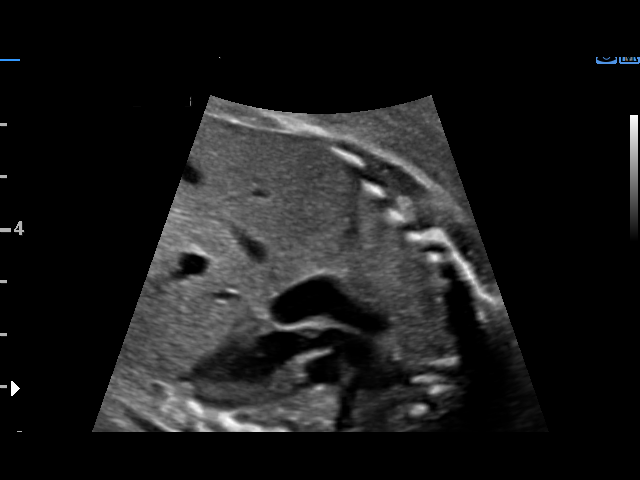
[im 17/49]
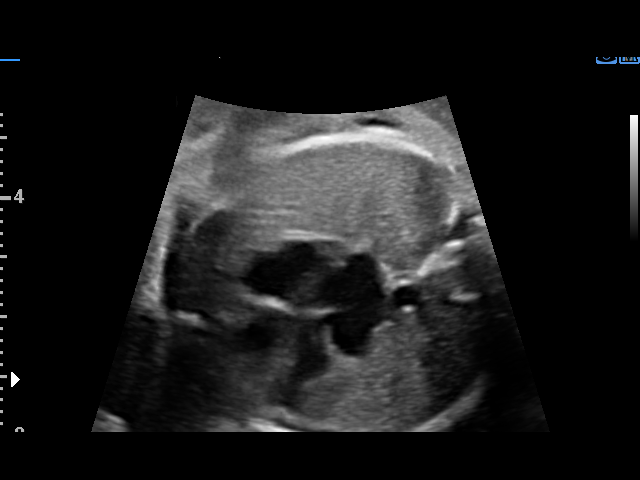
[im 20/49]
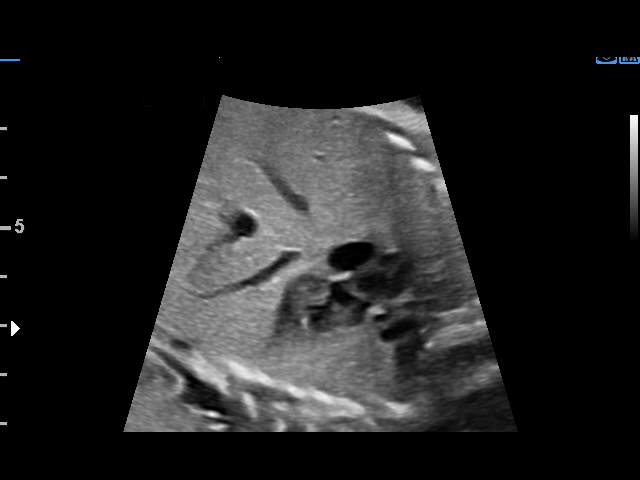
[im 24/49]
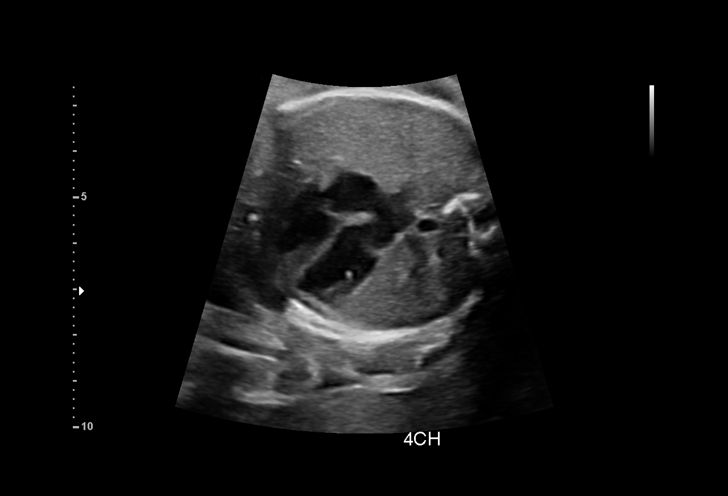
[im 27/49]
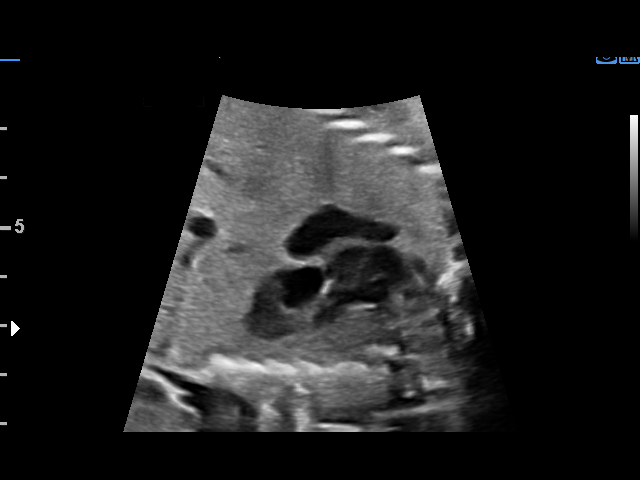
[im 31/49]
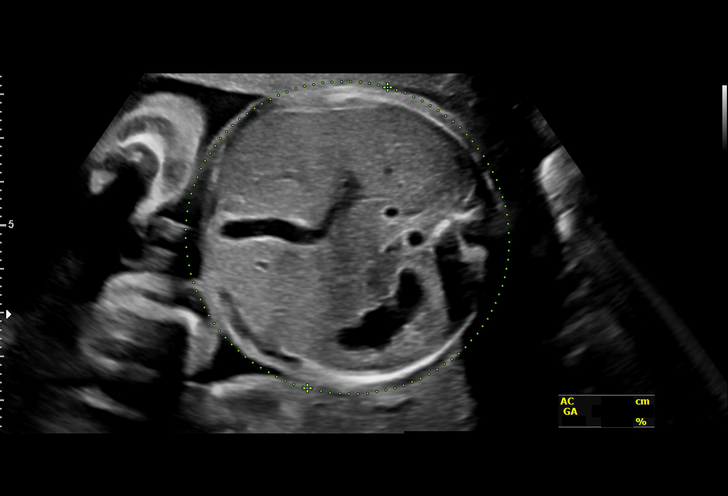
[im 34/49]
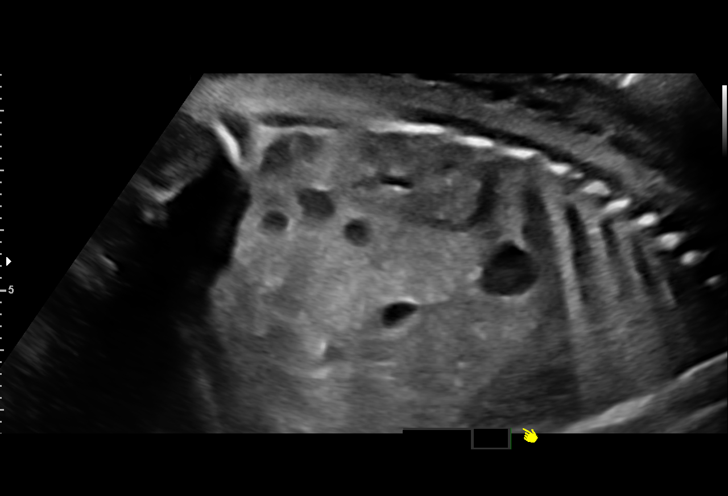
[im 38/49]
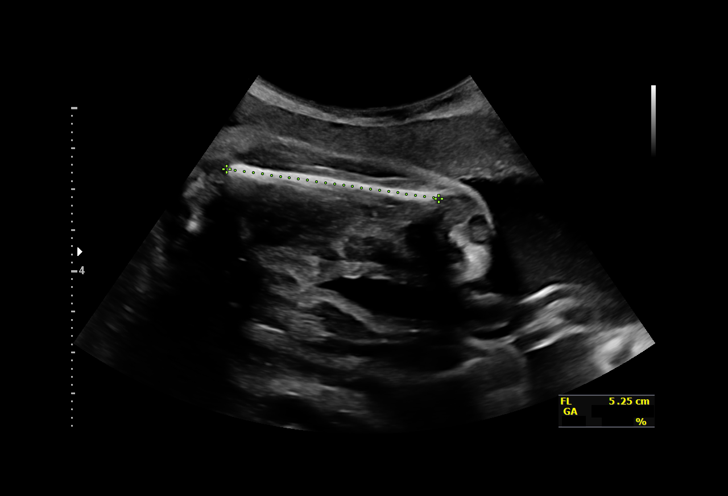
[im 41/49]
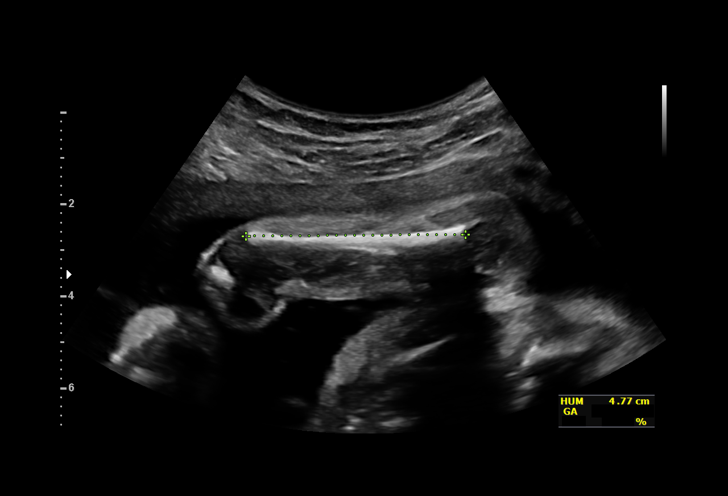
[im 45/49]
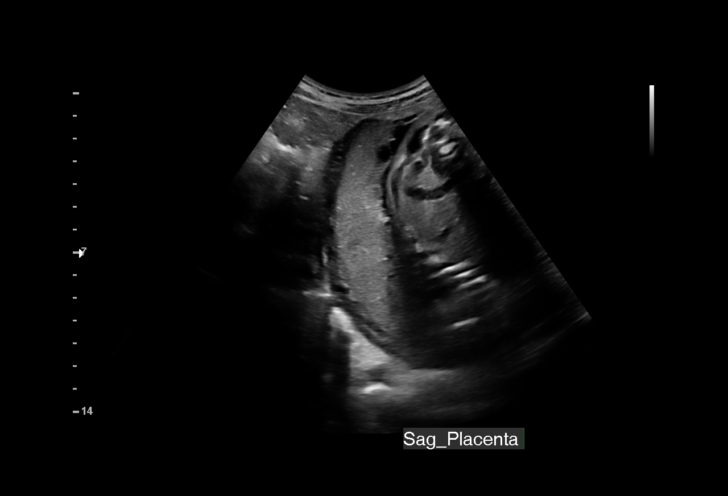
[im 49/49]
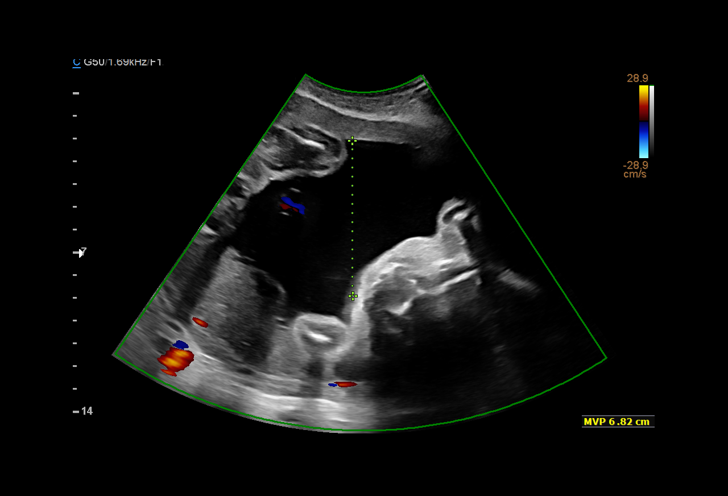

[14 of 28 positions shown; findings below may reference images not displayed]

Indications

Encounter for other antenatal screening
follow-up
26 weeks gestation of pregnancy
Echogenic intracardiac focus of the heart
(EIF)
Previous cesarean delivery, antepartum x2
Vital Signs

BMI:
Fetal Evaluation

Num Of Fetuses:         1
Fetal Heart Rate(bpm):  142
Cardiac Activity:       Observed
Presentation:           Cephalic
Placenta:               Fundal, posterior
P. Cord Insertion:      Previously Visualized

Amniotic Fluid
AFI FV:      Within normal limits

Largest Pocket(cm)
6.82
Biometry

BPD:      66.4  mm     G. Age:  26w 5d         41  %    CI:        74.44   %    70 - 86
FL/HC:      21.2   %    18.6 -
HC:      244.3  mm     G. Age:  26w 4d         19  %    HC/AC:      1.07        1.05 -
AC:       229   mm     G. Age:  27w 2d         59  %    FL/BPD:     78.0   %    71 - 87
FL:       51.8  mm     G. Age:  27w 4d         64  %    FL/AC:      22.6   %    20 - 24
HUM:      47.7  mm     G. Age:  28w 0d         76  %
LV:          4  mm

Est. FW:    5866  gm      2 lb 5 oz     65  %
OB History

Gravidity:    4         Term:   2         SAB:   1
Living:       2
Gestational Age

LMP:           23w 5d        Date:  02/28/18                 EDD:   12/05/18
U/S Today:     27w 0d                                        EDD:   11/12/18
Best:          26w 5d     Det. By:  U/S  (07/16/18)          EDD:   11/14/18
Anatomy

Cranium:               Appears normal         Aortic Arch:            Previously seen
Cavum:                 Previously seen        Ductal Arch:            Previously seen
Ventricles:            Appears normal         Diaphragm:              Appears normal
Choroid Plexus:        Previously seen        Stomach:                Appears normal, left
sided
Cerebellum:            Previously seen        Abdomen:                Previously seen
Posterior Fossa:       Previously seen        Abdominal Wall:         Previously seen
Nuchal Fold:           Not applicable (>20    Cord Vessels:           Previously seen
wks GA)
Face:                  Orbits and profile     Kidneys:                Appear normal
previously seen
Lips:                  Previously seen        Bladder:                Appears normal
Thoracic:              Appears normal         Spine:                  Previously seen
Heart:                 Echogenic focus        Upper Extremities:      Previously seen
in LV
RVOT:                  Appears normal         Lower Extremities:      Previously seen
LVOT:                  Appears normal

Other:  Female gender Heels and 5th digit prev visualized. Nasal bone prev
visualized. Open hands prev visualized.
Cervix Uterus Adnexa

Cervix
Not visualized (advanced GA >70wks)
Impression

Fetal growth is appropriate for gestational age. Amniotic fluid
is normal and good fetal activity is seen. Good interval growth
is seen.
Recommendations

Follow-up as clinically indicated.

## 2020-02-14 ENCOUNTER — Ambulatory Visit: Payer: Medicaid Other

## 2022-04-10 ENCOUNTER — Encounter: Payer: Self-pay | Admitting: Student

## 2022-04-10 ENCOUNTER — Ambulatory Visit (INDEPENDENT_AMBULATORY_CARE_PROVIDER_SITE_OTHER): Payer: Medicaid Other | Admitting: Student

## 2022-04-10 VITALS — BP 122/84 | HR 78 | Ht <= 58 in | Wt 109.0 lb

## 2022-04-10 DIAGNOSIS — Z789 Other specified health status: Secondary | ICD-10-CM

## 2022-04-10 DIAGNOSIS — R634 Abnormal weight loss: Secondary | ICD-10-CM | POA: Insufficient documentation

## 2022-04-10 DIAGNOSIS — Z7689 Persons encountering health services in other specified circumstances: Secondary | ICD-10-CM

## 2022-04-10 DIAGNOSIS — Z603 Acculturation difficulty: Secondary | ICD-10-CM

## 2022-04-10 NOTE — Assessment & Plan Note (Signed)
In-person Swahili interpreter present.

## 2022-04-10 NOTE — Progress Notes (Signed)
    SUBJECTIVE:   CHIEF COMPLAINT / HPI:   [name] presents to re-establish care. She immigrated to the [place] from [place] in 2016. She feels the transition was tough initially, but now has settled in and has plenty of friends/a joy she enjoys.  Social history Last went to doctor: Has been a while since she has seen a doctor- years Occupation: Works in a Associate Professor, which she enjoys Lives with: 3 kids (20,24,27 years old) and husband.  Smoking/Vaping:  No smoking Alcohol: Sometimes, [place] here and there, 1x a month Marijuana/ilicit substances:   Exercise: None other than work and exercise   Allergies: None Daily medications: None Medical diagnoses: None Surgical hx: 3x C-sections; wisdom tooth removal Family hx:  No hx cancer or heart disease. Mom and Dad both have HTN. No diabetes.   Concerns today: Unintentional weight loss of around ~20 lbs since 2021. Denies food insecurity/obtaining food. Not having much appetite for around 1.5 years. No major life stressors. Denies anxiety or depression. No fevers, chills. No abdominal pain. Normal bowel movements- no blood in stools, thin stools. No abdominal bloating/early satiety.  PERTINENT  PMH / PSH:   OBJECTIVE:   BP 122/84   Pulse 78   Ht 4\' 9"  (1.448 m)   Wt 109 lb (49.4 kg)   LMP 03/22/2022   SpO2 98%   BMI 23.59 kg/m   General: Well-appearing, well-nourished, in no distress, pleasant HEENT: Atraumatic, normocephalic. Pupils PERRLA, EOMI. Thyroid gland without nodules but fullness palpated  CV: RRR Resp: No labored breathing. Normal lung sounds in all fields. Abdomen: Soft, thin, non-tender, non-distended. No palpable masses Skin: Warm, dry, no rashes or lesions   ASSESSMENT/PLAN:   Language barrier In-person Swahili interpreter present.  Unintentional weight loss 2/2 low appetite. No cocnern for depression/anxiety. PHQ-9 score 7, #9 negative. No red flag signs/symptoms. No family hx  cancer. No signs of disordered eating. No food insecurity. Possible enlarged thyroid/hyperthyroid sx's Checking TSH, CMP, Pre-albumin today Provided pt w/ ensure supplements to prevent nutritional deficiencies Can consider nutrition referral in the future F/u in 2 weeks   Due for Pap Smear- last completed in 2019- negative for intraepithelial lesions/malignancy  2020, DO [facility]

## 2022-04-10 NOTE — Assessment & Plan Note (Addendum)
2/2 low appetite. No cocnern for depression/anxiety. PHQ-9 score 7, #9 negative. No red flag signs/symptoms. No family hx cancer. No signs of disordered eating. No food insecurity. Possible enlarged thyroid/hyperthyroid sx's Checking TSH, CMP, Pre-albumin today Provided pt w/ ensure supplements to prevent nutritional deficiencies Can consider nutrition referral in the future F/u in 2 weeks

## 2022-04-10 NOTE — Patient Instructions (Signed)
It was great to see you! Thank you for allowing me to participate in your care!  I recommend that you always bring your medications to each appointment as this makes it easy to ensure we are on the correct medications and helps [place] not miss when refills are needed.  Our plans for today:  - Start drinking Ensure supplements 2-3 times a day - Log your food and bring to the next visit   Please schedule an appointment in 2-3 weeks.  We are checking some labs today, I will call you if they are abnormal will send you a MyChart message or a letter if they are normal.  If you do not hear about your labs in the next 2 weeks please let us know.  Take care and seek immediate care sooner if you develop any concerns.   Dr. Darral Dash, DO [facility]

## 2022-04-11 LAB — CBC WITH DIFFERENTIAL/PLATELET
Basophils Absolute: 0 10*3/uL (ref 0.0–0.2)
Basos: 0 %
EOS (ABSOLUTE): 0.2 10*3/uL (ref 0.0–0.4)
Eos: 4 %
Hematocrit: 41.9 % (ref 34.0–46.6)
Hemoglobin: 13.5 g/dL (ref 11.1–15.9)
Immature Grans (Abs): 0 10*3/uL (ref 0.0–0.1)
Immature Granulocytes: 0 %
Lymphocytes Absolute: 2.5 10*3/uL (ref 0.7–3.1)
Lymphs: 51 %
MCH: 29.9 pg (ref 26.6–33.0)
MCHC: 32.2 g/dL (ref 31.5–35.7)
MCV: 93 fL (ref 79–97)
Monocytes Absolute: 0.4 10*3/uL (ref 0.1–0.9)
Monocytes: 8 %
Neutrophils Absolute: 1.8 10*3/uL (ref 1.4–7.0)
Neutrophils: 37 %
Platelets: 221 10*3/uL (ref 150–450)
RBC: 4.52 x10E6/uL (ref 3.77–5.28)
RDW: 12.8 % (ref 11.7–15.4)
WBC: 4.8 10*3/uL (ref 3.4–10.8)

## 2022-04-11 LAB — COMPREHENSIVE METABOLIC PANEL
ALT: 11 IU/L (ref 0–32)
AST: 15 IU/L (ref 0–40)
Albumin/Globulin Ratio: 1.6 (ref 1.2–2.2)
Albumin: 4.4 g/dL (ref 3.9–5.0)
Alkaline Phosphatase: 50 IU/L (ref 44–121)
BUN/Creatinine Ratio: 8 — ABNORMAL LOW (ref 9–23)
BUN: 7 mg/dL (ref 6–20)
Bilirubin Total: 0.4 mg/dL (ref 0.0–1.2)
CO2: 20 mmol/L (ref 20–29)
Calcium: 9.2 mg/dL (ref 8.7–10.2)
Chloride: 103 mmol/L (ref 96–106)
Creatinine, Ser: 0.86 mg/dL (ref 0.57–1.00)
Globulin, Total: 2.7 g/dL (ref 1.5–4.5)
Glucose: 73 mg/dL (ref 70–99)
Potassium: 4.5 mmol/L (ref 3.5–5.2)
Sodium: 138 mmol/L (ref 134–144)
Total Protein: 7.1 g/dL (ref 6.0–8.5)
eGFR: 95 mL/min/{1.73_m2} (ref >59)

## 2022-04-11 LAB — PREALBUMIN: PREALBUMIN: 22 mg/dL (ref 14–35)

## 2022-04-11 LAB — TSH RFX ON ABNORMAL TO FREE T4: TSH: 2.22 u[IU]/mL (ref 0.450–4.500)

## 2022-04-13 ENCOUNTER — Encounter: Payer: Self-pay | Admitting: Student

## 2022-05-06 ENCOUNTER — Other Ambulatory Visit (HOSPITAL_COMMUNITY)
Admission: RE | Admit: 2022-05-06 | Discharge: 2022-05-06 | Disposition: A | Discharge status: Home / Self Care | Payer: Medicaid Other | Source: Ambulatory Visit | Attending: Family Medicine | Admitting: Family Medicine

## 2022-05-06 ENCOUNTER — Ambulatory Visit (INDEPENDENT_AMBULATORY_CARE_PROVIDER_SITE_OTHER): Payer: Medicaid Other | Admitting: Student

## 2022-05-06 ENCOUNTER — Encounter: Payer: Self-pay | Admitting: Student

## 2022-05-06 VITALS — BP 110/76 | HR 57 | Ht <= 58 in | Wt 110.0 lb

## 2022-05-06 DIAGNOSIS — Z124 Encounter for screening for malignant neoplasm of cervix: Secondary | ICD-10-CM

## 2022-05-06 DIAGNOSIS — Z01419 Encounter for gynecological examination (general) (routine) without abnormal findings: Secondary | ICD-10-CM | POA: Diagnosis not present

## 2022-05-06 DIAGNOSIS — R4586 Emotional lability: Secondary | ICD-10-CM | POA: Insufficient documentation

## 2022-05-06 DIAGNOSIS — R634 Abnormal weight loss: Secondary | ICD-10-CM | POA: Diagnosis not present

## 2022-05-06 NOTE — Progress Notes (Signed)
    SUBJECTIVE:   CHIEF COMPLAINT / HPI:   Follow-up for weight loss - Labs unremarkable- no hyperthyroidism seen w/ TSH - Has been drinking Ensures shakes - Weight today: 110.0 lbs, stable from prior visit  Due for Pap Smear - LMP: 7/8-7/11; normal in duration and severity - Sexually active with husband - Not using contraceptives, husband s/p vasectomy  Mood Changes - Feeling stressed financially- sometimes has trouble paying bills - Works a lot, husband does not work much - Denies food insecurity - Says she does have thoughts of not wanting to be alive because of stress but denies any active thoughts of self-harm and does not have a plan for hurting herself  PERTINENT  PMH / PSH: Reviewed  OBJECTIVE:   BP 110/76   Pulse (!) 57   Ht 4\' 9"  (1.448 m)   Wt 110 lb (49.9 kg)   LMP 04/19/2022   SpO2 99%   BMI 23.80 kg/m   General: Well-appearing, no distress CV: RRR, no murmurs Resp: Normal WOB on room air, no wheezing or crackles Abd: Soft, NTND GU: External vulva and vagina nonerythematous, without any obvious lesions or rash. Scant white discharge present in posterior fornix.  Normal ruggae of vaginal walls.  Cervix is non erythematous and non-friable.   Skin: No rashes or lesions Psych: Normal mood and affect. Pleasant.  ASSESSMENT/PLAN:   Mood changes -PHQ-9 #9 positive (score 1), discussed w/ pt and no concern for active SI. Score 13 from 7 at last visit 1 month ago, worsened and mostly circumstantial w/ finances and stressor of being mom and wife and working a lot -Provided therapy resources -Discussed return precautions -F/u 1 month, if mood still low can trial antidepressant  Encounter for cervical Pap smear with pelvic exam Pap smear completed today. Declined STI screening; low-risk No abnormalities seen Will f/u results  Unintentional weight loss Weight stable Will monitor     06/20/2022, DO [facility]

## 2022-05-06 NOTE — Assessment & Plan Note (Signed)
Weight stable Will monitor 

## 2022-05-06 NOTE — Assessment & Plan Note (Signed)
Pap smear completed today. Declined STI screening; low-risk No abnormalities seen Will f/u results

## 2022-05-06 NOTE — Patient Instructions (Addendum)
So good to see you, Sally Hampton.  - We completed your Pap Smear today for cervical cancer screening. I will call you with your results. - See a list of therapy resources below who accept your insurance.  I want to see you back in 1 month to see how you are doing. We can discuss starting an antidepressant if you are still feeling low.  Dr. Darral Dash, DO [facility]     Therapy and Counseling Resources Most providers on this list will take Medicaid. Patients with commercial insurance or Medicare should contact their insurance company to get a list of in network providers. [facility] (take medicaid and medicare) [address]  [place], [place] [address]       [phone]     [facility] (under & uninsured) [address]   [place] [phone]    [email or web address]    [facility] [address]  [place]    [phone]  [facility] -[address]     Phone:  [phone]     [facility]-  [address]  [phone]   [facility] [address]      [place], [place] [phone] ext [phone]  [facility]: [address] [place], [place], [place]  [phone]   [facility] (Spanish therapist) [email or web address]  [address] [place]  [address]   [place] [address]    [phone]    [facility]   [address],  [place], [place]  [phone]   [facility]  [address] [place]  [phone]  [facility]  [address] [place], [place]        [phone]  Open Access/Walk In Clinic under & uninsured  [facility]  [address] [place], [place] [phone] Crisis [phone]  [facility],  (Spanish)   [address], [place]: [phone]) 8:30 - 12; 1 - 2:30  [facility] [employer],  [address] [place], [place]    ([phone]):8:30 - 12; 2 - 3PM  [facility] [employer],  [address],  [place]; [phone]):   Mon - Fri 8 AM - 5 PM  [facility] [address] [place]  MWF 12:30 to 3:00 or call to schedule an appointment  [phone]  Specific Provider options Psychology Today  [email or web address] click on find a therapist  enter your zip code left side and select or tailor a therapist for your specific need.   [facility] Provider Directory [email or web address]  (Medicaid)   Follow all drop down to find a provider  Social Support program [facility] [phone] or [employer] [address], [place], [place] Recovery support and educational   24- Hour Availability:   [facility]  [address] [place], [place] [phone] Crisis [phone]  [facility] [phone]  [facility]  [phone]   [facility]  [phone] (after hours)  Therapeutic Alternative/Mobile Crisis   [phone]  [place] National Suicide Hotline  8104513515 Len Childs)  Call 911 or go to emergency room  [facility]  [phone]);  [place] and [employer]  [phone]); [place], [place], [place], [place], [place], [place], [place]

## 2022-05-06 NOTE — Assessment & Plan Note (Signed)
-  PHQ-9 #9 positive (score 1), discussed w/ pt and no concern for active SI. 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

## 2022-05-08 LAB — CYTOLOGY - PAP
Adequacy: ABSENT
Diagnosis: NEGATIVE

## 2022-09-30 ENCOUNTER — Ambulatory Visit (HOSPITAL_COMMUNITY)
Admission: EM | Admit: 2022-09-30 | Discharge: 2022-09-30 | Disposition: A | Discharge status: Home / Self Care | Payer: Medicaid Other | Attending: Internal Medicine | Admitting: Internal Medicine

## 2022-09-30 ENCOUNTER — Encounter (HOSPITAL_COMMUNITY): Payer: Self-pay | Admitting: Emergency Medicine

## 2022-09-30 DIAGNOSIS — J029 Acute pharyngitis, unspecified: Secondary | ICD-10-CM | POA: Diagnosis not present

## 2022-09-30 DIAGNOSIS — R059 Cough, unspecified: Secondary | ICD-10-CM | POA: Diagnosis present

## 2022-09-30 DIAGNOSIS — Z1152 Encounter for screening for COVID-19: Secondary | ICD-10-CM | POA: Diagnosis not present

## 2022-09-30 DIAGNOSIS — R519 Headache, unspecified: Secondary | ICD-10-CM

## 2022-09-30 DIAGNOSIS — J069 Acute upper respiratory infection, unspecified: Secondary | ICD-10-CM

## 2022-09-30 DIAGNOSIS — R300 Dysuria: Secondary | ICD-10-CM | POA: Diagnosis not present

## 2022-09-30 DIAGNOSIS — M545 Low back pain, unspecified: Secondary | ICD-10-CM | POA: Insufficient documentation

## 2022-09-30 LAB — POC URINE PREG, ED: Preg Test, Ur: NEGATIVE

## 2022-09-30 LAB — POCT URINALYSIS DIPSTICK, ED / UC
Bilirubin Urine: NEGATIVE
Glucose, UA: NEGATIVE mg/dL
Leukocytes,Ua: NEGATIVE
Nitrite: NEGATIVE
Protein, ur: >=300 mg/dL — AB
Specific Gravity, Urine: >=1.03 (ref 1.005–1.030)
Urobilinogen, UA: 1 mg/dL (ref 0.0–1.0)
pH: 6 (ref 5.0–8.0)

## 2022-09-30 LAB — SARS CORONAVIRUS 2 (TAT 6-24 HRS): SARS Coronavirus 2: NEGATIVE

## 2022-09-30 MED ORDER — IBUPROFEN 800 MG PO TABS
800.0000 mg | ORAL_TABLET | Freq: Once | ORAL | Status: AC
  Administered 2022-09-30: 800 mg via ORAL

## 2022-09-30 MED ORDER — CETIRIZINE HCL 10 MG PO TABS: 10.0000 mg | ORAL_TABLET | Freq: Every day | ORAL | 2 refills | Status: DC

## 2022-09-30 MED ORDER — IBUPROFEN 600 MG PO TABS: 600.0000 mg | ORAL_TABLET | Freq: Four times a day (QID) | ORAL | 0 refills | Status: AC | PRN

## 2022-09-30 MED ORDER — BENZONATATE 100 MG PO CAPS: 100.0000 mg | ORAL_CAPSULE | Freq: Three times a day (TID) | ORAL | 0 refills | Status: DC

## 2022-09-30 MED ORDER — IBUPROFEN 800 MG PO TABS
ORAL_TABLET | ORAL | Status: AC
  Filled 2022-09-30: qty 1

## 2022-09-30 NOTE — ED Triage Notes (Signed)
Pt reports headache, lower back pain and dysuria. Symptoms started on Sunday. Has been taking Tylenol.

## 2022-09-30 NOTE — Discharge Instructions (Signed)
You have a viral upper respiratory infection.  COVID-19 testing is pending. We will call you with results if positive. If your COVID test is positive, you must stay at home until day 6 of symptoms. On day 6, you may go out into public and go back to work, but you must wear a mask until day 11 of symptoms to prevent spread to others.  Take cetirizine 10mg  tablet once daily to help dry up your runny nose.  Take tessalon pearles every 8 hours as needed for cough.  You may take tylenol 1,000mg  and ibuprofen 600mg  every 6 hours with food as needed for fever/chills, sore throat, aches/pains, and inflammation associated with viral illness. Take this with food to avoid stomach upset.    Your urine shows that you are dehydrated. Please drink plenty of water to stay well hydrated.  If you develop any new or worsening symptoms, please return.  If your symptoms are severe, please go to the emergency room.  Follow-up with your primary care provider for further evaluation and management of your symptoms as well as ongoing wellness visits.  I hope you feel better!

## 2022-09-30 NOTE — ED Provider Notes (Signed)
[facility]    CSN: [id number] Arrival date & time: 09/30/22  0865      History   Chief Complaint Chief Complaint  Patient presents with   Headache   Back Pain   Dysuria    HPI Sally Hampton is a 26 y.o. female.   Patient presents urgent care for evaluation of headache, cough, rhinorrhea, sore throat, dysuria, and low back pain that started on Sunday, September 28, 2022 (2 days ago).  She states she notices her urine has been darker than normal and more "hot" than normal.  She is tolerating food and fluids well without nausea, vomiting, dizziness, diarrhea, constipation, or abdominal pain.  She denies urinary urgency, hesitancy, frequency, and hematuria.  Denies chance of pregnancy.  Cough is dry and nonproductive.  Headache is generalized and currently a 6 on a scale of 0-10.  She denies dizziness, vision changes, and tinnitus/ear pain.  She has been taking Tylenol for symptoms over-the-counter without relief.  Last dose of Tylenol was this morning.  Denies fever, chills, flank pain, history of chronic medical problems or respiratory problems, and drug use.  No known sick contacts with similar symptoms.   Headache Associated symptoms: back pain   Back Pain Associated symptoms: dysuria and headaches   Dysuria   Past Medical History:  Diagnosis Date   Previous cesarean section    X 2 in [place]    Patient Active Problem List   Diagnosis Date Noted   Mood changes 05/06/2022   Encounter for cervical Pap smear with pelvic exam 05/06/2022   Unintentional weight loss 04/10/2022   Severe preeclampsia, delivered 11/22/2018   Post-dates pregnancy 11/20/2018   Eye lesion 01/07/2017   Language barrier 08/22/2015    Past Surgical History:  Procedure Laterality Date   CESAREAN SECTION     CESAREAN SECTION N/A 11/20/2018   Procedure: CESAREAN SECTION;  Surgeon: Conan Bowens, MD;  Location: [facility]ING SU[facility];  Service: Obstetrics;  Laterality: N/A;   WISDOM TOOTH  EXTRACTION  2016    OB History     Gravida  4   Para  3   Term  3   Preterm      AB  1   Living  3      SAB  1   IAB      Ectopic      Multiple      Live Births  3            Home Medications    Prior to Admission medications   Medication Sig Start Date End Date Taking? Authorizing Provider  benzonatate (TESSALON) 100 MG capsule Take 1 capsule (100 mg total) by mouth every 8 (eight) hours. 09/30/22  Yes Carlisle Beers, FNP  cetirizine (ZYRTEC) 10 MG tablet Take 1 tablet (10 mg total) by mouth daily. 09/30/22  Yes Carlisle Beers, FNP  ibuprofen (ADVIL) 600 MG tablet Take 1 tablet (600 mg total) by mouth every 6 (six) hours as needed. 09/30/22  Yes Carlisle Beers, FNP  amLODipine (NORVASC) 5 MG tablet Take 1 tablet (5 mg total) by mouth daily. 11/23/18   Anyanwu, Jethro Bastos, MD  docusate sodium (COLACE) 100 MG capsule Take 1 capsule (100 mg total) by mouth 2 (two) times daily as needed for mild constipation or moderate constipation. Patient not taking: Reported on 11/29/2019 11/22/18   Tereso Newcomer, MD  oxyCODONE (OXY IR/ROXICODONE) 5 MG immediate release tablet Take 1 tablet (5 mg total) by  mouth every 4 (four) hours as needed for severe pain or breakthrough pain. Patient not taking: Reported on 11/29/2019 11/22/18   Tereso NewcomerAnyanwu, Ugonna A, MD    Family History Family History  Problem Relation Age of Onset   Hypertension Mother     Social History Social History   Tobacco Use   Smoking status: Never   Smokeless tobacco: Never  Substance Use Topics   Alcohol use: Not Currently    Alcohol/week: 0.0 standard drinks of alcohol    Comment: not in pregnancy   Drug use: Never     Allergies   Patient has no known allergies.   Review of Systems Review of Systems  Genitourinary:  Positive for dysuria.  Musculoskeletal:  Positive for back pain.  Neurological:  Positive for headaches.  Per HPI   Physical Exam Triage Vital Signs ED  Triage Vitals  Enc Vitals Group     BP 09/30/22 1232 (!) 135/105     Pulse Rate 09/30/22 1232 74     Resp 09/30/22 1232 18     Temp 09/30/22 1232 98.7 F (37.1 C)     Temp Source 09/30/22 1232 Oral     SpO2 09/30/22 1232 97 %     Weight --      Height --      Head Circumference --      Peak Flow --      Pain Score 09/30/22 1231 6     Pain Loc --      Pain Edu? --      Excl. in GC? --    No data found.  Updated Vital Signs BP (!) 135/105 (BP Location: Right Arm)   Pulse 74   Temp 98.7 F (37.1 C) (Oral)   Resp 18   SpO2 97%   Visual Acuity Right Eye Distance:   Left Eye Distance:   Bilateral Distance:    Right Eye Near:   Left Eye Near:    Bilateral Near:     Physical Exam Vitals and nursing note reviewed.  Constitutional:      Appearance: She is not ill-appearing or toxic-appearing.  HENT:     Head: Normocephalic and atraumatic.     Right Ear: Hearing, tympanic membrane, ear canal and external ear normal.     Left Ear: Hearing, tympanic membrane, ear canal and external ear normal.     Nose: Rhinorrhea present.     Mouth/Throat:     Lips: Pink.     Mouth: Mucous membranes are moist.     Pharynx: No posterior oropharyngeal erythema.  Eyes:     General: Lids are normal. Vision grossly intact. Gaze aligned appropriately.     Extraocular Movements: Extraocular movements intact.     Conjunctiva/sclera: Conjunctivae normal.     Pupils: Pupils are equal, round, and reactive to light.  Cardiovascular:     Rate and Rhythm: Normal rate and regular rhythm.     Heart sounds: Normal heart sounds, S1 normal and S2 normal.  Pulmonary:     Effort: Pulmonary effort is normal. No respiratory distress.     Breath sounds: Normal breath sounds and air entry.  Abdominal:     General: Bowel sounds are normal.     Palpations: Abdomen is soft.     Tenderness: There is no abdominal tenderness. There is no right CVA tenderness, left CVA tenderness or guarding.  Musculoskeletal:      Cervical back: Neck supple.     Right lower leg: No edema.  Left lower leg: No edema.  Lymphadenopathy:     Cervical: No cervical adenopathy.  Skin:    General: Skin is warm and dry.     Capillary Refill: Capillary refill takes less than 2 seconds.     Findings: No rash.  Neurological:     General: No focal deficit present.     Mental Status: She is alert and oriented to person, place, and time. Mental status is at baseline.     Cranial Nerves: No dysarthria or facial asymmetry.  Psychiatric:        Mood and Affect: Mood normal.        Speech: Speech normal.        Behavior: Behavior normal.        Thought Content: Thought content normal.        Judgment: Judgment normal.      UC Treatments / Results  Labs (all labs ordered are listed, but only abnormal results are displayed) Labs Reviewed  POCT URINALYSIS DIPSTICK, ED / UC - Abnormal; Notable for the following components:      Result Value   Ketones, ur TRACE (*)    Hgb urine dipstick TRACE (*)    Protein, ur >=300 (*)    All other components within normal limits  SARS CORONAVIRUS 2 (TAT 6-24 HRS)  POC URINE PREG, ED    EKG   Radiology No results found.  Procedures Procedures (including critical care time)  Medications Ordered in UC Medications  ibuprofen (ADVIL) tablet 800 mg (800 mg Oral Given 09/30/22 1259)    Initial Impression / Assessment and Plan / UC Course  I have reviewed the triage vital signs and the nursing notes.  Pertinent labs & imaging results that were available during my care of the patient were reviewed by me and considered in my medical decision making (see chart for details).   1. Viral URI with cough, sore throat, bad headache Symptoms and physical exam consistent with a viral upper respiratory tract infection that will likely resolve with rest, fluids, and prescriptions for symptomatic relief. No indication for imaging today based on stable cardiopulmonary exam and  hemodynamically stable vital signs. COVID-19 testing is pending.  We will call patient if this is positive.  Quarantine guidelines discussed. Currently on day 3 of symptoms and does not qualify for antiviral therapy.   Patient given ibuprofen 800mg  in clinic today for generalized headache. Tessalon pearles, cetirizine, and ibuprofen sent to pharmacy for symptomatic relief to be taken as prescribed. May use ibuprofen/tylenol over the counter for body aches, fever/chills, and overall discomfort associated with viral illness. Nonpharmacologic interventions for symptom relief provided and after visit summary below.   2. Dysuria, low back pain without sciatica Urinalysis shows ketones, hematuria, and elevated specific gravity. Patient is likely dehydrated, has been advised to increase water intake to at least 64 ounces of water per day. Follow-up with PCP recommended in the next 2-3 weeks for further evaluation if dysuria and low back pain persist.  Strict ED/urgent care return precautions given.  Patient verbalizes understanding and agreement with plan.  Counseled patient regarding possible side effects and uses of all medications prescribed at today's visit.  Patient verbalizes understanding and agreement with plan.  All questions answered.  Patient discharged from urgent care in stable condition.        Final Clinical Impressions(s) / UC Diagnoses   Final diagnoses:  Viral URI with cough  Sore throat  Acute bilateral low back pain without sciatica  Dysuria  Bad headache     Discharge Instructions      You have a viral upper respiratory infection.  COVID-19 testing is pending. We will call you with results if positive. If your COVID test is positive, you must stay at home until day 6 of symptoms. On day 6, you may go out into public and go back to work, but you must wear a mask until day 11 of symptoms to prevent spread to others.  Take cetirizine 10mg  tablet once daily to help dry up your  runny nose.  Take tessalon pearles every 8 hours as needed for cough.  You may take tylenol 1,000mg  and ibuprofen 600mg  every 6 hours with food as needed for fever/chills, sore throat, aches/pains, and inflammation associated with viral illness. Take this with food to avoid stomach upset.    Your urine shows that you are dehydrated. Please drink plenty of water to stay well hydrated.  If you develop any new or worsening symptoms, please return.  If your symptoms are severe, please go to the emergency room.  Follow-up with your primary care provider for further evaluation and management of your symptoms as well as ongoing wellness visits.  I hope you feel better!       ED Prescriptions     Medication Sig Dispense Auth. Provider   benzonatate (TESSALON) 100 MG capsule Take 1 capsule (100 mg total) by mouth every 8 (eight) hours. 21 capsule M, FNP   ibuprofen (ADVIL) 600 MG tablet Take 1 tablet (600 mg total) by mouth every 6 (six) hours as needed. 30 tablet Reita May, FNP   cetirizine (ZYRTEC) 10 MG tablet Take 1 tablet (10 mg total) by mouth daily. 30 tablet M, FNP      PDMP not reviewed this encounter.   Carlisle Beers, Carlisle Beers 09/30/22 1306

## 2023-08-22 ENCOUNTER — Ambulatory Visit (HOSPITAL_COMMUNITY): Payer: Medicaid Other

## 2023-08-25 ENCOUNTER — Other Ambulatory Visit: Payer: Self-pay

## 2023-08-25 ENCOUNTER — Ambulatory Visit (HOSPITAL_COMMUNITY)
Admission: RE | Admit: 2023-08-25 | Discharge: 2023-08-25 | Disposition: A | Discharge status: Home / Self Care | Payer: Medicaid Other | Source: Ambulatory Visit | Attending: Family Medicine | Admitting: Family Medicine

## 2023-08-25 VITALS — BP 150/85 | HR 57 | Temp 98.0°F | Resp 16

## 2023-08-25 DIAGNOSIS — H9202 Otalgia, left ear: Secondary | ICD-10-CM | POA: Diagnosis not present

## 2023-08-25 MED ORDER — LORATADINE 10 MG PO TABS: 10.0000 mg | ORAL_TABLET | Freq: Every day | ORAL | 1 refills | Status: DC

## 2023-08-25 NOTE — ED Provider Notes (Signed)
[facility]   [id number] 08/25/23 Arrival Time: 8295  ASSESSMENT & PLAN:  1. Otalgia of left ear    No sign of ear infection. Ques congestion/allergies. Discussed. Trial: Meds ordered this encounter  Medications   loratadine (CLARITIN) 10 MG tablet    Sig: Take 1 tablet (10 mg total) by mouth daily.    Dispense:  30 tablet    Refill:  1   Discussed typical duration of symptoms. OTC symptom care as needed. Ensure adequate fluid intake and rest. May f/u with PCP or here as needed.  Reviewed expectations re: course of current medical issues. Questions answered. Outlined signs and symptoms indicating need for more acute intervention. Patient verbalized understanding. After Visit Summary given.   SUBJECTIVE: History from: patient. Sally Hampton is a 27 y.o. female who presents with complaint of L earache; few days; off/on; noted a smal amt of bleeding yesterday; none today; occas uses Qtips in ears. Denies hearing changes. No tx PTA. Denies fever. Is sneezing more than usual.  Social History   Tobacco Use  Smoking Status Never  Smokeless Tobacco Never     OBJECTIVE:  Vitals:   08/25/23 1015  BP: (!) 150/85  Pulse: (!) 57  Resp: 16  Temp: 98 F (36.7 C)  TempSrc: Oral  SpO2: 99%     General appearance: alert; appears fatigued HEENT: mild nasal congestion; clear runny nose; throat irritation secondary to post-nasal drainage; L TM with mild serous otitis Neck: supple without LAD CV: RRR Lungs: unlabored respirations, symmetrical air entry without wheezing; cough: absent Abd: soft Ext: no LE edema Skin: warm and dry Psychological: alert and cooperative; normal mood and affect  No Known Allergies  Past Medical History:  Diagnosis Date   Previous cesarean section    X 2 in [place]   Family History  Problem Relation Age of Onset   Hypertension Mother    Social History   Socioeconomic History   Marital status: Married    Spouse name:  Not on file   Number of children: Not on file   Years of education: Not on file   Highest education level: Not on file  Occupational History   Not on file  Tobacco Use   Smoking status: Never   Smokeless tobacco: Never  Substance and Sexual Activity   Alcohol use: Not Currently    Alcohol/week: 0.0 standard drinks of alcohol    Comment: not in pregnancy   Drug use: Never   Sexual activity: Yes    Birth control/protection: None  Other Topics Concern   Not on file  Social History Narrative   Not on file   Social Determinants of Health   Financial Resource Strain: Not on file  Food Insecurity: Food Insecurity Present (10/27/2018)   Hunger Vital Sign    Worried About Running Out of Food in the Last Year: Sometimes true    Ran Out of Food in the Last Year: Sometimes true  Transportation Needs: No Transportation Needs (10/27/2018)   PRAPARE - Administrator, Civil Service (Medical): No    Lack of Transportation (Non-Medical): No  Physical Activity: Not on file  Stress: Not on file  Social Connections: Not on file  Intimate Partner Violence: Not At Risk (11/16/2018)   Humiliation, Afraid, Rape, and Kick questionnaire    Fear of Current or Ex-Partner: No    Emotionally Abused: No    Physically Abused: No    Sexually Abused: No  Mardella Layman, MD 08/25/23 1120

## 2023-08-25 NOTE — ED Triage Notes (Signed)
Left ear pain since last Thursday, states she had blood drainage on Thursday only. No fever or chills.

## 2024-02-17 ENCOUNTER — Ambulatory Visit (HOSPITAL_COMMUNITY)
Admission: RE | Admit: 2024-02-17 | Discharge: 2024-02-17 | Disposition: A | Discharge status: Home / Self Care | Source: Ambulatory Visit | Attending: Family Medicine | Admitting: Family Medicine

## 2024-02-17 ENCOUNTER — Encounter (HOSPITAL_COMMUNITY): Payer: Self-pay

## 2024-02-17 ENCOUNTER — Other Ambulatory Visit: Payer: Self-pay

## 2024-02-17 VITALS — BP 145/92 | HR 58 | Temp 98.1°F | Resp 16

## 2024-02-17 DIAGNOSIS — Z3202 Encounter for pregnancy test, result negative: Secondary | ICD-10-CM | POA: Diagnosis not present

## 2024-02-17 DIAGNOSIS — R634 Abnormal weight loss: Secondary | ICD-10-CM | POA: Diagnosis not present

## 2024-02-17 DIAGNOSIS — N3090 Cystitis, unspecified without hematuria: Secondary | ICD-10-CM | POA: Insufficient documentation

## 2024-02-17 LAB — COMPREHENSIVE METABOLIC PANEL WITH GFR
ALT: 14 U/L (ref 0–44)
AST: 20 U/L (ref 15–41)
Albumin: 4 g/dL (ref 3.5–5.0)
Alkaline Phosphatase: 34 U/L — ABNORMAL LOW (ref 38–126)
Anion gap: 9 (ref 5–15)
BUN: 6 mg/dL (ref 6–20)
CO2: 24 mmol/L (ref 22–32)
Calcium: 8.9 mg/dL (ref 8.9–10.3)
Chloride: 103 mmol/L (ref 98–111)
Creatinine, Ser: 0.76 mg/dL (ref 0.44–1.00)
GFR, Estimated: >60 mL/min (ref >60)
Glucose, Bld: 76 mg/dL (ref 70–99)
Potassium: 3.9 mmol/L (ref 3.5–5.1)
Sodium: 136 mmol/L (ref 135–145)
Total Bilirubin: 0.7 mg/dL (ref 0.0–1.2)
Total Protein: 7.6 g/dL (ref 6.5–8.1)

## 2024-02-17 LAB — CBC
HCT: 40.4 % (ref 36.0–46.0)
Hemoglobin: 13.8 g/dL (ref 12.0–15.0)
MCH: 31.7 pg (ref 26.0–34.0)
MCHC: 34.2 g/dL (ref 30.0–36.0)
MCV: 92.7 fL (ref 80.0–100.0)
Platelets: 233 10*3/uL (ref 150–400)
RBC: 4.36 MIL/uL (ref 3.87–5.11)
RDW: 12.3 % (ref 11.5–15.5)
WBC: 4.3 10*3/uL (ref 4.0–10.5)
nRBC: 0 % (ref 0.0–0.2)

## 2024-02-17 LAB — POCT URINALYSIS DIP (MANUAL ENTRY)
Bilirubin, UA: NEGATIVE
Glucose, UA: NEGATIVE mg/dL
Ketones, POC UA: NEGATIVE mg/dL
Nitrite, UA: NEGATIVE
Protein Ur, POC: 30 mg/dL — AB
Spec Grav, UA: >=1.03 — AB (ref 1.010–1.025)
Urobilinogen, UA: 0.2 U/dL
pH, UA: 5.5 (ref 5.0–8.0)

## 2024-02-17 LAB — TSH: TSH: 1.707 u[IU]/mL (ref 0.350–4.500)

## 2024-02-17 LAB — POCT URINE PREGNANCY: Preg Test, Ur: NEGATIVE

## 2024-02-17 LAB — HIV ANTIBODY (ROUTINE TESTING W REFLEX): HIV Screen 4th Generation wRfx: NONREACTIVE

## 2024-02-17 MED ORDER — CEPHALEXIN 500 MG PO CAPS: 500.0000 mg | ORAL_CAPSULE | Freq: Two times a day (BID) | ORAL | 0 refills | Status: DC

## 2024-02-17 NOTE — Discharge Instructions (Signed)
You have had labs (blood tests and a urine culture) sent today. We will call you with any significant abnormalities or if there is need to begin or change treatment or pursue further follow up.  You may also review your test results online through MyChart. If you do not have a MyChart account, instructions to sign up should be on your discharge paperwork.  

## 2024-02-17 NOTE — ED Notes (Signed)
No nausea currently

## 2024-02-17 NOTE — ED Provider Notes (Signed)
 [facility]    ASSESSMENT & PLAN:  1. Cystitis   2. Loss of weight    Denies current n/v; is tolerating PO fluid intake without problem. For UTI, begin: Meds ordered this encounter  Medications   cephALEXin  (KEFLEX ) 500 MG capsule    Sig: Take 1 capsule (500 mg total) by mouth 2 (two) times daily.    Dispense:  10 capsule    Refill:  0   No signs of pyelonephritis. Urine culture sent. Will notify patient of any significant results. Ensure proper hydration.  As for weight loss over past two years, unclear cause. Overall decreased appetite most day.  Labs Pending:  CBC  COMPREHENSIVE METABOLIC PANEL WITH GFR  TSH  HIV ANTIBODY (ROUTINE TESTING W REFLEX)   For reported weight loss, recommend:  Follow-up Information     Schedule an appointment as soon as possible for a visit  with [name], [name], DO.   Specialty: Family Medicine Why: To discuss your weight loss. Contact information: [address] [place] [address] [phone]                 Outlined signs and symptoms indicating need for more acute intervention. Patient verbalized understanding. After Visit Summary given.  SUBJECTIVE:  Sally Hampton is a 28 y.o. female who complains of urinary urgency and dysuria for the past 2 days. Without associated flank pain, fever, chills, vaginal discharge or bleeding. Gross hematuria: not present. Does report n/v on first day but this has subsided now. Normal PO intake today. Denies current abdominal pain. Does mention that she is concerned about her weight. Would like to weigh more. Reports decreasing weight over past two years. Reports she just doesn't feel hungry most of the time. Denies GERD symptoms.  LMP: Patient's last menstrual period was 01/23/2024 (approximate).  OBJECTIVE:  Vitals:   02/17/24 1115 02/17/24 1117  BP: (!) 153/98 (!) 145/92  Pulse: (!) 58   Resp: 16   Temp: 98.1 F (36.7 C)   TempSrc: Oral   SpO2: 98%     General appearance: alert; no distress HENT: oropharynx: moist Lungs: unlabored respirations Abdomen: soft, non-tender Back: no CVA tenderness Extremities: no edema; symmetrical with no gross deformities Skin: warm and dry Neurologic: normal gait Psychological: alert and cooperative; normal mood and affect  Labs Reviewed  POCT URINALYSIS DIP (MANUAL ENTRY) - Abnormal; Notable for the following components:      Result Value   Spec Grav, UA >=1.030 (*)    Blood, UA moderate (*)    Protein Ur, POC =30 (*)    Leukocytes, UA Small (1+) (*)    All other components within normal limits  URINE CULTURE  CBC  COMPREHENSIVE METABOLIC PANEL WITH GFR  TSH  HIV ANTIBODY (ROUTINE TESTING W REFLEX)  POCT URINE PREGNANCY    No Known Allergies  Past Medical History:  Diagnosis Date   Previous cesarean section    X 2 in [place]   Social History   Socioeconomic History   Marital status: Married    Spouse name: Not on file   Number of children: Not on file   Years of education: Not on file   Highest education level: Not on file  Occupational History   Not on file  Tobacco Use   Smoking status: Never   Smokeless tobacco: Never  Vaping Use   Vaping status: Never Used  Substance and Sexual Activity   Alcohol use: Not Currently    Alcohol/week: 0.0 standard drinks  of alcohol    Comment: not in pregnancy   Drug use: Never   Sexual activity: Yes    Birth control/protection: None  Other Topics Concern   Not on file  Social History Narrative   Not on file   Social Drivers of Health   Financial Resource Strain: Not on file  Food Insecurity: Food Insecurity Present (10/27/2018)   Hunger Vital Sign    Worried About Running Out of Food in the Last Year: Sometimes true    Ran Out of Food in the Last Year: Sometimes true  Transportation Needs: No Transportation Needs (10/27/2018)   PRAPARE - Administrator, Civil Service (Medical): No    Lack of Transportation  (Non-Medical): No  Physical Activity: Not on file  Stress: Not on file  Social Connections: Not on file  Intimate Partner Violence: Not At Risk (11/16/2018)   Humiliation, Afraid, Rape, and Kick questionnaire    Fear of Current or Ex-Partner: No    Emotionally Abused: No    Physically Abused: No    Sexually Abused: No   Family History  Problem Relation Age of Onset   Hypertension Mother         Afton Albright, MD 02/17/24 1308

## 2024-02-17 NOTE — ED Triage Notes (Signed)
 Abdominal pain started Sunday.  Started vomiting on Sunday. Burning urination and small amounts started Monday  Has not taken any medications for this

## 2024-02-19 LAB — URINE CULTURE: Culture: >=100000 — AB

## 2024-02-23 ENCOUNTER — Ambulatory Visit (HOSPITAL_COMMUNITY): Payer: Self-pay

## 2024-02-24 ENCOUNTER — Ambulatory Visit (HOSPITAL_COMMUNITY)
Admission: RE | Admit: 2024-02-24 | Discharge: 2024-02-24 | Disposition: A | Discharge status: Home / Self Care | Payer: Self-pay | Source: Ambulatory Visit | Attending: Nurse Practitioner | Admitting: Nurse Practitioner

## 2024-02-24 ENCOUNTER — Other Ambulatory Visit: Payer: Self-pay

## 2024-02-24 ENCOUNTER — Encounter (HOSPITAL_COMMUNITY): Payer: Self-pay

## 2024-02-24 VITALS — BP 135/82 | HR 62 | Temp 98.0°F | Resp 16

## 2024-02-24 DIAGNOSIS — N3001 Acute cystitis with hematuria: Secondary | ICD-10-CM | POA: Insufficient documentation

## 2024-02-24 DIAGNOSIS — Z3202 Encounter for pregnancy test, result negative: Secondary | ICD-10-CM | POA: Diagnosis present

## 2024-02-24 LAB — POCT URINALYSIS DIP (MANUAL ENTRY)
Bilirubin, UA: NEGATIVE
Glucose, UA: NEGATIVE mg/dL
Ketones, POC UA: NEGATIVE mg/dL
Leukocytes, UA: NEGATIVE
Nitrite, UA: NEGATIVE
Protein Ur, POC: NEGATIVE mg/dL
Spec Grav, UA: 1.02 (ref 1.010–1.025)
Urobilinogen, UA: 0.2 U/dL
pH, UA: 5.5 (ref 5.0–8.0)

## 2024-02-24 LAB — POCT URINE PREGNANCY: Preg Test, Ur: NEGATIVE

## 2024-02-24 MED ORDER — NITROFURANTOIN MONOHYD MACRO 100 MG PO CAPS: 100.0000 mg | ORAL_CAPSULE | Freq: Two times a day (BID) | ORAL | 0 refills | Status: AC

## 2024-02-24 NOTE — Discharge Instructions (Addendum)
 Please take the Macrobid twice daily for 5 days as prescribed to treat the urinary symptoms and any remaining UTI.  Will contact you if the urine culture shows we need to change the antibiotic.  Continue drinking plenty of water.  Seek care if symptoms worsen despite treatment.

## 2024-02-24 NOTE — ED Provider Notes (Signed)
 [facility]    CSN: [id number] Arrival date & time: 02/24/24  1044      History   Chief Complaint Chief Complaint  Patient presents with   Dysuria    Entered by patient    HPI Sally Hampton is a 28 y.o. female.   Patient presents today with 9-day history of pain with urination.  Reports she was seen on 02/17/2024, was treated with Keflex  and symptoms overall improved but she is still having pain with urination.  She denies urinary frequency or urgency, new urinary incontinence, foul urinary odor, hematuria, abdominal pain, back pain, suprapubic pain or pressure, flank pain, fever, and nausea/vomiting.  No vaginal discharge.  No concern for STI today.  Patient is currently on menstrual cycle and is confident she is not pregnant.  No concern for STI today.    Past Medical History:  Diagnosis Date   Previous cesarean section    X 2 in [place]    Patient Active Problem List   Diagnosis Date Noted   Mood changes 05/06/2022   Encounter for cervical Pap smear with pelvic exam 05/06/2022   Unintentional weight loss 04/10/2022   Severe preeclampsia, delivered 11/22/2018   Post-dates pregnancy 11/20/2018   Eye lesion 01/07/2017   Language barrier 08/22/2015    Past Surgical History:  Procedure Laterality Date   CESAREAN SECTION     CESAREAN SECTION N/A 11/20/2018   Procedure: CESAREAN SECTION;  Surgeon: Jan Mcgill, MD;  Location: [facility];  Service: Obstetrics;  Laterality: N/A;   WISDOM TOOTH EXTRACTION  2016    OB History     Gravida  4   Para  3   Term  3   Preterm      AB  1   Living  3      SAB  1   IAB      Ectopic      Multiple      Live Births  3            Home Medications    Prior to Admission medications   Medication Sig Start Date End Date Taking? Authorizing Provider  nitrofurantoin, macrocrystal-monohydrate, (MACROBID) 100 MG capsule Take 1 capsule (100 mg total) by mouth 2 (two) times daily for 5 days.  02/24/24 02/29/24 Yes Wilhemena Harbour, NP  ibuprofen  (ADVIL ) 600 MG tablet Take 1 tablet (600 mg total) by mouth every 6 (six) hours as needed. 09/30/22   Starlene Eaton, FNP    Family History Family History  Problem Relation Age of Onset   Hypertension Mother     Social History Social History   Tobacco Use   Smoking status: Never   Smokeless tobacco: Never  Vaping Use   Vaping status: Never Used  Substance Use Topics   Alcohol use: Not Currently    Alcohol/week: 0.0 standard drinks of alcohol    Comment: not in pregnancy   Drug use: Never     Allergies   Patient has no known allergies.   Review of Systems Review of Systems Per HPI  Physical Exam Triage Vital Signs ED Triage Vitals  Encounter Vitals Group     BP 02/24/24 1059 135/82     Systolic BP Percentile --      Diastolic BP Percentile --      Pulse Rate 02/24/24 1059 62     Resp 02/24/24 1059 16     Temp 02/24/24 1059 98 F (36.7 C)  Temp Source 02/24/24 1059 Oral     SpO2 02/24/24 1059 98 %     Weight --      Height --      Head Circumference --      Peak Flow --      Pain Score 02/24/24 1100 3     Pain Loc --      Pain Education --      Exclude from Growth Chart --    No data found.  Updated Vital Signs BP 135/82 (BP Location: Right Arm)   Pulse 62   Temp 98 F (36.7 C) (Oral)   Resp 16   LMP 01/23/2024 (Approximate)   SpO2 98%   Visual Acuity Right Eye Distance:   Left Eye Distance:   Bilateral Distance:    Right Eye Near:   Left Eye Near:    Bilateral Near:     Physical Exam Vitals and nursing note reviewed.  Constitutional:      General: She is not in acute distress.    Appearance: She is not toxic-appearing.  Pulmonary:     Effort: Pulmonary effort is normal. No respiratory distress.  Abdominal:     General: Abdomen is flat. Bowel sounds are normal. There is no distension.     Palpations: Abdomen is soft. There is no mass.     Tenderness: There is no  abdominal tenderness. There is no right CVA tenderness, left CVA tenderness or guarding.  Skin:    General: Skin is warm and dry.     Coloration: Skin is not jaundiced or pale.     Findings: No erythema.  Neurological:     Mental Status: She is alert and oriented to person, place, and time.     Motor: No weakness.     Gait: Gait normal.  Psychiatric:        Behavior: Behavior is cooperative.      UC Treatments / Results  Labs (all labs ordered are listed, but only abnormal results are displayed) Labs Reviewed  POCT URINALYSIS DIP (MANUAL ENTRY) - Abnormal; Notable for the following components:      Result Value   Blood, UA moderate (*)    All other components within normal limits  URINE CULTURE  POCT URINE PREGNANCY    EKG   Radiology No results found.  Procedures Procedures (including critical care time)  Medications Ordered in UC Medications - No data to display  Initial Impression / Assessment and Plan / UC Course  I have reviewed the triage vital signs and the nursing notes.  Pertinent labs & imaging results that were available during my care of the patient were reviewed by me and considered in my medical decision making (see chart for details).   Patient is well-appearing, normotensive, afebrile, not tachycardic, not tachypneic, oxygenating well on room air.    1. Acute cystitis with hematuria 2. Urine pregnancy test negative Urinalysis today is positive for moderate blood Urine culture is pending Via chart review, most recent urine culture reviewed showing staph bacteria, susceptible to Macrobid, so will treat with Macrobid twice daily for 5 days Strict ER and return precautions discussed with patient  The patient was given the opportunity to ask questions.  All questions answered to their satisfaction.  The patient is in agreement to this plan.   Final Clinical Impressions(s) / UC Diagnoses   Final diagnoses:  Acute cystitis with hematuria  Urine  pregnancy test negative     Discharge Instructions  Please take the Macrobid twice daily for 5 days as prescribed to treat the urinary symptoms and any remaining UTI.  Will contact you if the urine culture shows we need to change the antibiotic.  Continue drinking plenty of water.  Seek care if symptoms worsen despite treatment.  ED Prescriptions     Medication Sig Dispense Auth. Provider   nitrofurantoin, macrocrystal-monohydrate, (MACROBID) 100 MG capsule Take 1 capsule (100 mg total) by mouth 2 (two) times daily for 5 days. 10 capsule Wilhemena Harbour, NP      PDMP not reviewed this encounter.   Wilhemena Harbour, NP 02/24/24 [phone]

## 2024-02-24 NOTE — ED Triage Notes (Signed)
 Pt states she was seen last week and diagnosed with a UTI. She finish the PO abx, but she still having some burning sensation with urination.

## 2024-02-25 LAB — URINE CULTURE: Culture: NO GROWTH
